# Patient Record
Sex: Female | Born: 1965 | Race: Black or African American | Hispanic: No | Marital: Married | Smoking: Never smoker
Health system: Southern US, Community
[De-identification: ages and names within clinical notes are randomized; demographics above are authoritative.]

## PROBLEM LIST (undated history)

## (undated) DIAGNOSIS — I10 Essential (primary) hypertension: Secondary | ICD-10-CM

## (undated) DIAGNOSIS — T7840XA Allergy, unspecified, initial encounter: Secondary | ICD-10-CM

## (undated) DIAGNOSIS — D219 Benign neoplasm of connective and other soft tissue, unspecified: Secondary | ICD-10-CM

## (undated) DIAGNOSIS — N946 Dysmenorrhea, unspecified: Secondary | ICD-10-CM

## (undated) HISTORY — DX: Dysmenorrhea, unspecified: N94.6

## (undated) HISTORY — DX: Allergy, unspecified, initial encounter: T78.40XA

## (undated) HISTORY — PX: MYOMECTOMY: SHX85

## (undated) HISTORY — DX: Benign neoplasm of connective and other soft tissue, unspecified: D21.9

## (undated) HISTORY — PX: LASER ABLATION OF THE CERVIX: SHX1949

---

## 2008-04-05 ENCOUNTER — Other Ambulatory Visit: Admission: RE | Admit: 2008-04-05 | Discharge: 2008-04-05 | Payer: Self-pay | Admitting: Obstetrics & Gynecology

## 2009-10-09 ENCOUNTER — Emergency Department (HOSPITAL_COMMUNITY): Admission: EM | Admit: 2009-10-09 | Discharge: 2009-10-09 | Payer: Self-pay | Admitting: Emergency Medicine

## 2009-10-10 ENCOUNTER — Observation Stay (HOSPITAL_COMMUNITY): Admission: EM | Admit: 2009-10-10 | Discharge: 2009-10-10 | Payer: Self-pay | Admitting: Family Medicine

## 2009-10-10 ENCOUNTER — Ambulatory Visit: Payer: Self-pay | Admitting: Family Medicine

## 2009-10-16 ENCOUNTER — Ambulatory Visit (HOSPITAL_COMMUNITY): Admission: RE | Admit: 2009-10-16 | Discharge: 2009-10-16 | Payer: Self-pay | Admitting: Family Medicine

## 2011-02-19 LAB — COMPREHENSIVE METABOLIC PANEL
ALT: 11 U/L (ref 0–35)
BUN: 14 mg/dL (ref 6–23)
CO2: 24 mEq/L (ref 19–32)
Calcium: 9 mg/dL (ref 8.4–10.5)
Calcium: 9.7 mg/dL (ref 8.4–10.5)
Creatinine, Ser: 0.8 mg/dL (ref 0.4–1.2)
GFR calc non Af Amer: >60 mL/min (ref >60)
Glucose, Bld: 104 mg/dL — ABNORMAL HIGH (ref 70–99)
Glucose, Bld: 109 mg/dL — ABNORMAL HIGH (ref 70–99)
Sodium: 137 mEq/L (ref 135–145)
Total Protein: 6.6 g/dL (ref 6.0–8.3)

## 2011-02-19 LAB — CBC
Hemoglobin: 11.9 g/dL — ABNORMAL LOW (ref 12.0–15.0)
Hemoglobin: 12.7 g/dL (ref 12.0–15.0)
MCHC: 33.8 g/dL (ref 30.0–36.0)
MCHC: 34.4 g/dL (ref 30.0–36.0)
MCV: 86.5 fL (ref 78.0–100.0)
RBC: 4.34 MIL/uL (ref 3.87–5.11)
RDW: 13.1 % (ref 11.5–15.5)
RDW: 13.3 % (ref 11.5–15.5)

## 2011-02-19 LAB — PROTIME-INR
INR: 1 (ref 0.00–1.49)
Prothrombin Time: 13.1 seconds (ref 11.6–15.2)

## 2011-02-19 LAB — LIPID PANEL
LDL Cholesterol: 89 mg/dL (ref 0–99)
Total CHOL/HDL Ratio: 3.5 RATIO
VLDL: 14 mg/dL (ref 0–40)

## 2011-02-19 LAB — CARDIAC PANEL(CRET KIN+CKTOT+MB+TROPI)
Relative Index: INVALID (ref 0.0–2.5)
Total CK: 76 U/L (ref 7–177)
Total CK: 86 U/L (ref 7–177)
Troponin I: 0.01 ng/mL (ref 0.00–0.06)

## 2011-02-19 LAB — POCT CARDIAC MARKERS: Myoglobin, poc: 75.4 ng/mL (ref 12–200)

## 2011-02-19 LAB — DIFFERENTIAL
Lymphocytes Relative: 29 % (ref 12–46)
Lymphs Abs: 2.3 10*3/uL (ref 0.7–4.0)
Neutro Abs: 5.2 10*3/uL (ref 1.7–7.7)
Neutrophils Relative %: 66 % (ref 43–77)

## 2011-02-19 LAB — H. PYLORI ANTIBODY, IGG: H Pylori IgG: <0.4 {ISR}

## 2011-02-19 LAB — APTT: aPTT: 32 seconds (ref 24–37)

## 2011-02-19 LAB — TSH: TSH: 3.218 u[IU]/mL (ref 0.350–4.500)

## 2011-04-17 ENCOUNTER — Other Ambulatory Visit: Payer: Self-pay | Admitting: Family Medicine

## 2011-04-17 DIAGNOSIS — R102 Pelvic and perineal pain: Secondary | ICD-10-CM

## 2011-04-25 ENCOUNTER — Other Ambulatory Visit: Payer: Self-pay

## 2011-11-05 ENCOUNTER — Ambulatory Visit (INDEPENDENT_AMBULATORY_CARE_PROVIDER_SITE_OTHER): Payer: BC Managed Care – PPO

## 2011-11-05 DIAGNOSIS — I10 Essential (primary) hypertension: Secondary | ICD-10-CM

## 2012-01-04 ENCOUNTER — Encounter (HOSPITAL_COMMUNITY): Payer: Self-pay | Admitting: Emergency Medicine

## 2012-01-04 ENCOUNTER — Emergency Department (HOSPITAL_COMMUNITY)
Admission: EM | Admit: 2012-01-04 | Discharge: 2012-01-05 | Disposition: A | Discharge status: Home / Self Care | Payer: BC Managed Care – PPO | Attending: Emergency Medicine | Admitting: Emergency Medicine

## 2012-01-04 DIAGNOSIS — R109 Unspecified abdominal pain: Secondary | ICD-10-CM | POA: Insufficient documentation

## 2012-01-04 DIAGNOSIS — Z79899 Other long term (current) drug therapy: Secondary | ICD-10-CM | POA: Insufficient documentation

## 2012-01-04 DIAGNOSIS — R002 Palpitations: Secondary | ICD-10-CM | POA: Insufficient documentation

## 2012-01-04 DIAGNOSIS — R6883 Chills (without fever): Secondary | ICD-10-CM | POA: Insufficient documentation

## 2012-01-04 DIAGNOSIS — R000 Tachycardia, unspecified: Secondary | ICD-10-CM | POA: Insufficient documentation

## 2012-01-04 DIAGNOSIS — R21 Rash and other nonspecific skin eruption: Secondary | ICD-10-CM | POA: Insufficient documentation

## 2012-01-04 DIAGNOSIS — R1310 Dysphagia, unspecified: Secondary | ICD-10-CM | POA: Insufficient documentation

## 2012-01-04 DIAGNOSIS — R42 Dizziness and giddiness: Secondary | ICD-10-CM | POA: Insufficient documentation

## 2012-01-04 DIAGNOSIS — T781XXA Other adverse food reactions, not elsewhere classified, initial encounter: Secondary | ICD-10-CM | POA: Insufficient documentation

## 2012-01-04 DIAGNOSIS — R498 Other voice and resonance disorders: Secondary | ICD-10-CM | POA: Insufficient documentation

## 2012-01-04 DIAGNOSIS — R110 Nausea: Secondary | ICD-10-CM | POA: Insufficient documentation

## 2012-01-04 DIAGNOSIS — T7840XA Allergy, unspecified, initial encounter: Secondary | ICD-10-CM

## 2012-01-04 DIAGNOSIS — R0602 Shortness of breath: Secondary | ICD-10-CM | POA: Insufficient documentation

## 2012-01-04 HISTORY — DX: Essential (primary) hypertension: I10

## 2012-01-04 MED ORDER — METHYLPREDNISOLONE SODIUM SUCC 125 MG IJ SOLR
125.0000 mg | Freq: Once | INTRAMUSCULAR | Status: AC
  Administered 2012-01-04: 125 mg via INTRAVENOUS
  Filled 2012-01-04: qty 2

## 2012-01-04 MED ORDER — PREDNISONE 10 MG PO TABS: 20.0000 mg | ORAL_TABLET | Freq: Every day | ORAL | Status: DC

## 2012-01-04 MED ORDER — FAMOTIDINE 20 MG PO TABS: 20.0000 mg | ORAL_TABLET | Freq: Two times a day (BID) | ORAL | Status: DC

## 2012-01-04 MED ORDER — FAMOTIDINE IN NACL 20-0.9 MG/50ML-% IV SOLN
20.0000 mg | Freq: Once | INTRAVENOUS | Status: AC
  Administered 2012-01-04: 20 mg via INTRAVENOUS
  Filled 2012-01-04: qty 50

## 2012-01-04 NOTE — ED Provider Notes (Signed)
History     CSN: [id number]  Arrival date & time 01/04/12  2116   First MD Initiated Contact with Patient 01/04/12 2145      Chief Complaint  Patient presents with  . Allergic Reaction  . Angioedema    (Consider location/radiation/quality/duration/timing/severity/associated sxs/prior treatment) HPI Comments: Approximately 30 minutes after eating crab legs.  Patient noticed a feeling of lightheadedness thinning of her tong diffuse erythema and pruritus.   She states that she has had a previous mild allergic reaction to crab legs, but dismissed it as she "loves" crab legs has had no other reaction to seafood, but does react similarly to chicken.  Has never had to come to the hospital for allergic reaction.  Usually this is treated at home with by mouth Benadryl  Patient is a 46 y.o. female presenting with allergic reaction. The history is provided by the patient.  Allergic Reaction The primary symptoms are  shortness of breath, abdominal pain, nausea, dizziness, palpitations, rash, angioedema and urticaria. The primary symptoms do not include cough, vomiting or diarrhea. The current episode started less than 1 hour ago.  The shortness of breath began today. The shortness of breath developed suddenly.  The abdominal pain began today. The abdominal pain is generalized. The severity of the abdominal pain is 2/10.  She describes the dizziness as lightheadedness. The dizziness began today. The dizziness has been resolved since its onset. Dizziness also occurs with nausea. Dizziness does not occur with vomiting or weakness.   The palpitations also occurred with dizziness and shortness of breath.   The rash began today.  The angioedema began less than 1 hour ago. The angioedema has been resolved since its onset. It is located on the tongue. The angioedema is associated with shortness of breath.   The urticaria began less than 1 hour ago. The urticaria has been rapidly worsening since its onset.    The onset of the reaction was associated with eating.    Past Medical History  Diagnosis Date  . Hypertension     No past surgical history on file.  No family history on file.  History  Substance Use Topics  . Smoking status: Never Smoker   . Smokeless tobacco: Not on file  . Alcohol Use: Yes    OB History    Grav Para Term Preterm Abortions TAB SAB Ect Mult Living                  Review of Systems  Constitutional: Positive for chills. Negative for activity change.  HENT: Positive for trouble swallowing and voice change.   Eyes: Negative for visual disturbance.  Respiratory: Positive for shortness of breath. Negative for cough and choking.   Cardiovascular: Positive for palpitations.  Gastrointestinal: Positive for nausea and abdominal pain. Negative for vomiting and diarrhea.  Skin: Positive for color change and rash.  Neurological: Positive for dizziness. Negative for weakness and numbness.    Allergies  Review of patient's allergies indicates no known allergies.  Home Medications   Current Outpatient Rx  Name Route Sig Dispense Refill  . LISINOPRIL-HYDROCHLOROTHIAZIDE 10-12.5 MG PO TABS Oral Take 1 tablet by mouth daily.    Marland Kitchen FAMOTIDINE 20 MG PO TABS Oral Take 1 tablet (20 mg total) by mouth 2 (two) times daily. 14 tablet 0  . PREDNISONE 10 MG PO TABS Oral Take 2 tablets (20 mg total) by mouth daily. 15 tablet 0    BP 157/78  Pulse 118  Temp(Src) 98.5 F (36.9 C) (  Oral)  Resp 20  Ht 5\' 1"  (1.549 m)  Wt 178 lb 6.4 oz (80.922 kg)  BMI 33.71 kg/m2  SpO2 98%  LMP 12/14/2011  Physical Exam  Constitutional: She is oriented to person, place, and time. She appears well-developed and well-nourished. No distress.  HENT:  Head: Normocephalic. No trismus in the jaw.  Nose: Nose normal.  Mouth/Throat: No uvula swelling. No posterior oropharyngeal edema.  Eyes: Pupils are equal, round, and reactive to light.  Neck: No tracheal deviation present.   Cardiovascular: Regular rhythm.  Tachycardia present.   Pulmonary/Chest: Breath sounds normal. No respiratory distress. She has no wheezes.  Abdominal: Soft. She exhibits no distension. There is no tenderness.  Musculoskeletal: She exhibits no edema and no tenderness.  Neurological: She is alert and oriented to person, place, and time.  Skin: Skin is warm. Rash noted. She is not diaphoretic. There is erythema.    ED Course  Procedures (including critical care time)  Labs Reviewed - No data to display No results found.   1. Allergic reaction     1150 patient reassessed.  Tongue feels back to normal.  No visible swelling, palms and soles are back to normal color.  No more ureter.  Not tachycardic.  Breath sounds clear  MDM  Acute allergic reaction        Arman Filter, NP 01/05/12 0002  Arman Filter, NP 01/05/12 0002

## 2012-01-04 NOTE — ED Notes (Signed)
Pt had crab legs earlier around 6:30 pm and is having an allergic reaction.  Pt's face is moderately swollen and is feeling itchy all over.  Not having any trouble breathing or feeling like her throat is affected.

## 2012-01-04 NOTE — ED Notes (Signed)
Pt states @ 1 hour after eating crab legs began experiencing itching all over, swelling noted to face, hands, hives noted to chest. Husband states pt did take 2 Benadryl earlier after he noticed speech change. Patient states she did have minor reaction the last time she had crab legs, did subside with Benadryl. Speech clear at this time. Husband at bedside.

## 2012-01-05 ENCOUNTER — Encounter (HOSPITAL_COMMUNITY): Payer: Self-pay | Admitting: Family Medicine

## 2012-01-05 MED ORDER — EPINEPHRINE 0.3 MG/0.3ML IJ DEVI: 0.3000 mg | INTRAMUSCULAR | Status: DC | PRN

## 2012-01-05 MED ORDER — EPINEPHRINE 0.3 MG/0.3ML IJ DEVI: 0.3000 mg | INTRAMUSCULAR | Status: AC | PRN

## 2012-01-05 NOTE — ED Provider Notes (Signed)
Medical screening examination/treatment/procedure(s) were performed by non-physician practitioner and as supervising physician I was immediately available for consultation/collaboration.  Nicholes Stairs, MD 01/05/12 [id number]

## 2012-01-05 NOTE — Discharge Instructions (Signed)
Always carry her EpiPen with you. I have written for 2 keep one in your  pocketbook  and one in your car or home wherever you spend the most time.

## 2012-02-29 ENCOUNTER — Ambulatory Visit (INDEPENDENT_AMBULATORY_CARE_PROVIDER_SITE_OTHER): Payer: BC Managed Care – PPO | Admitting: Emergency Medicine

## 2012-02-29 VITALS — BP 144/86 | HR 91 | Temp 98.3°F | Resp 20 | Ht 60.5 in | Wt 179.0 lb

## 2012-02-29 DIAGNOSIS — H1045 Other chronic allergic conjunctivitis: Secondary | ICD-10-CM

## 2012-02-29 DIAGNOSIS — J029 Acute pharyngitis, unspecified: Secondary | ICD-10-CM

## 2012-02-29 DIAGNOSIS — J309 Allergic rhinitis, unspecified: Secondary | ICD-10-CM

## 2012-02-29 DIAGNOSIS — H1010 Acute atopic conjunctivitis, unspecified eye: Secondary | ICD-10-CM

## 2012-02-29 MED ORDER — PREDNISONE 10 MG PO TABS: ORAL_TABLET | ORAL | Status: DC

## 2012-02-29 MED ORDER — OLOPATADINE HCL 0.1 % OP SOLN: 1.0000 [drp] | Freq: Two times a day (BID) | OPHTHALMIC | Status: DC

## 2012-02-29 NOTE — Progress Notes (Signed)
  Subjective:    Patient ID: Mckenzie Hill, female    DOB: 04-Oct-1966, 46 y.o.   MRN: [id number]  HPI enters with chief complaints of worsening allergies. She has had irritation and redness of both eyes. She recently had a bad reaction to shellfish. She had to be treated at the emergency room. She's had a dry nonproductive cough. She denies any wheezing.    Review of Systems she also said that sore throat associated with these symptoms.     Objective:   Physical Exam  Constitutional: She appears well-developed and well-nourished.  HENT:  Right Ear: External ear normal.  Left Ear: External ear normal.       The throat is slightly red but there is no exudate.  Eyes: Pupils are equal, round, and reactive to light.       There is mild injection of the conjunctiva of both eyes.  Cardiovascular: Normal rate and regular rhythm.   Pulmonary/Chest: Breath sounds normal. No respiratory distress. She has no wheezes. She has no rales. She exhibits no tenderness.          Assessment & Plan:   Patient having a flare of her allergies. We'll do a strep test just to be sure there is no infection and treated as an allergic flare

## 2012-02-29 NOTE — Patient Instructions (Signed)
Allergic Rhinitis  Allergic rhinitis is when the mucous membranes in the nose respond to allergens. Allergens are particles in the air that cause your body to have an allergic reaction. This causes you to release allergic antibodies. Through a chain of events, these eventually cause you to release histamine into the blood stream (hence the use of antihistamines). Although meant to be protective to the body, it is this release that causes your discomfort, such as frequent sneezing, congestion and an itchy runny nose.    CAUSES    The pollen allergens may come from grasses, trees, and weeds. This is seasonal allergic rhinitis, or "hay fever." Other allergens cause year-round allergic rhinitis (perennial allergic rhinitis) such as house dust mite allergen, pet dander and mold spores.    SYMPTOMS     Nasal stuffiness (congestion).   Runny, itchy nose with sneezing and tearing of the eyes.   There is often an itching of the mouth, eyes and ears.  It cannot be cured, but it can be controlled with medications.  DIAGNOSIS    If you are unable to determine the offending allergen, skin or blood testing may find it.  TREATMENT     Avoid the allergen.   Medications and allergy shots (immunotherapy) can help.   Hay fever may often be treated with antihistamines in pill or nasal spray forms. Antihistamines block the effects of histamine. There are over-the-counter medicines that may help with nasal congestion and swelling around the eyes. Check with your caregiver before taking or giving this medicine.  If the treatment above does not work, there are many new medications your caregiver can prescribe. Stronger medications may be used if initial measures are ineffective. Desensitizing injections can be used if medications and avoidance fails. Desensitization is when a patient is given ongoing shots until the body becomes less sensitive to the allergen. Make sure you follow up with your caregiver if problems continue.  SEEK  MEDICAL CARE IF:     You develop fever (more than 100.5 F (38.1 C).   You develop a cough that does not stop easily (persistent).   You have shortness of breath.   You start wheezing.   Symptoms interfere with normal daily activities.  Document Released: 07/29/2001 Document Revised: 10/23/2011 Document Reviewed: 02/07/2009  [employer] Patient Information 2012 [employer].

## 2012-04-08 ENCOUNTER — Ambulatory Visit (INDEPENDENT_AMBULATORY_CARE_PROVIDER_SITE_OTHER): Payer: BC Managed Care – PPO | Admitting: Physician Assistant

## 2012-04-08 VITALS — BP 118/74 | HR 103 | Temp 98.7°F | Resp 16 | Ht 60.25 in | Wt 172.8 lb

## 2012-04-08 DIAGNOSIS — J309 Allergic rhinitis, unspecified: Secondary | ICD-10-CM

## 2012-04-08 MED ORDER — FLUTICASONE PROPIONATE 50 MCG/ACT NA SUSP: 2.0000 | Freq: Every day | NASAL | Status: DC

## 2012-04-08 MED ORDER — PREDNISONE 20 MG PO TABS: ORAL_TABLET | ORAL | Status: AC

## 2012-04-08 NOTE — Progress Notes (Signed)
Patient ID: Sabiha Sura MRN: [id number], DOB: 1966/08/31, 46 y.o. Date of Encounter: 04/08/2012, 2:12 PM  Primary Physician: Tally Due, MD, MD  Chief Complaint: Allergic rhinitis  HPI: 46 y.o. year old female with history below presents with flare up of allergic rhinitis. Seen the previous month for similar symptoms, although this episode is not a severe. She was doing well until several day ago. She recently stopped taking her Claritin which caused a flare up of her allergies. When she was taking her Claritin she was asymptomatic and doing well. She now complains of a 3-4 day history of congestion, rhinorrhea, sneezing, and cough. Afebrile throughout. Cough is nonproductive and secondary to post nasal drip. She requests to refill her Prednisone as this helped her considerably the last time.    Past Medical History  Diagnosis Date  . Hypertension      Home Meds: Prior to Admission medications   Medication Sig Start Date End Date Taking? Authorizing Provider  aspirin-acetaminophen-caffeine (EXCEDRIN MIGRAINE) [phone] MG per tablet Take 1 tablet by mouth every 6 (six) hours as needed.   Yes Historical Provider, MD  EPINEPHrine (EPIPEN) 0.3 mg/0.3 mL DEVI Inject 0.3 mLs (0.3 mg total) into the muscle as needed. 01/05/12  No Arman Filter, NP  lisinopril-hydrochlorothiazide (PRINZIDE,ZESTORETIC) 10-12.5 MG per tablet Take 1 tablet by mouth daily.   Yes Historical Provider, MD  loratadine-pseudoephedrine (CLARITIN-D 12-HOUR) 5-120 MG per tablet Take 1 tablet by mouth 2 (two) times daily.   Yes Historical Provider, MD  famotidine (PEPCID) 20 MG tablet Take 1 tablet (20 mg total) by mouth 2 (two) times daily. 01/04/12 01/03/13  Arman Filter, NP         GuaiFENesin (MUCINEX PO) Take 1 capsule by mouth as needed.   No Historical Provider, MD  guaifenesin (ROBITUSSIN) 100 MG/5ML syrup Take 200 mg by mouth 3 (three) times daily as needed.   No Historical Provider, MD  olopatadine (PATANOL)  0.1 % ophthalmic solution Place 1 drop into both eyes 2 (two) times daily. 02/29/12 02/28/13 No Collene Gobble, MD           Allergies:  Allergies  Allergen Reactions  . Shellfish Allergy Anaphylaxis, Hives and Itching    History   Social History  . Marital Status: Married    Spouse Name: N/A    Number of Children: N/A  . Years of Education: N/A   Occupational History  . Not on file.   Social History Main Topics  . Smoking status: Never Smoker   . Smokeless tobacco: Not on file  . Alcohol Use: Yes  . Drug Use: No  . Sexually Active: Not on file   Other Topics Concern  . Not on file   Social History Narrative  . No narrative on file     Review of Systems: Constitutional: negative for chills, fever, night sweats, weight changes, or fatigue  HEENT: negative for vision changes, hearing loss, ST, epistaxis, or sinus pressure Cardiovascular: negative for chest pain or palpitations Respiratory: negative for hemoptysis, wheezing, shortness of breath, or cough Abdominal: negative for abdominal pain, nausea, vomiting, diarrhea, or constipation Dermatological: negative for rash Neurologic: negative for headache, dizziness, or syncope All other systems reviewed and are otherwise negative with the exception to those above and in the HPI.   Physical Exam: Blood pressure 118/74, pulse 103, temperature 98.7 F (37.1 C), temperature source Oral, resp. rate 16, height 5' 0.25" (1.53 m), weight 172 lb 12.8 oz (78.382 kg), last  menstrual period 03/16/2012, SpO2 99.00%., Body mass index is 33.47 kg/(m^2). General: Well developed, well nourished, in no acute distress. Head: Normocephalic, atraumatic, eyes without discharge, sclera non-icteric, nares are pale and boggy. Bilateral auditory canals clear, TM's are without perforation, pearly grey and translucent with reflective cone of light bilaterally. Oral cavity moist, posterior pharynx without exudate, erythema, peritonsillar abscess, or  post nasal drip.  Neck: Supple. No thyromegaly. Full ROM. No lymphadenopathy. Lungs: Clear bilaterally to auscultation without wheezes, rales, or rhonchi. Breathing is unlabored. Heart: RRR with S1 S2. No murmurs, rubs, or gallops appreciated. Msk:  Strength and tone normal for age. Extremities/Skin: Warm and dry. No clubbing or cyanosis. No edema. No rashes or suspicious lesions. Neuro: Alert and oriented X 3. Moves all extremities spontaneously. Gait is normal. CNII-XII grossly in tact. Psych:  Responds to questions appropriately with a normal affect.     ASSESSMENT AND PLAN:  46 y.o. year old female with allergic rhinitis. -Prednisone 20 mg #12 3x2, 2x2, 1x2 no RF -Flonase 2 sprays each nare daily #1 RF 6 -Continue Claritin -RTC precautions   Signed, Eula Listen, PA-C 04/08/2012 2:12 PM

## 2012-05-17 DIAGNOSIS — Z0271 Encounter for disability determination: Secondary | ICD-10-CM

## 2012-06-29 ENCOUNTER — Ambulatory Visit (INDEPENDENT_AMBULATORY_CARE_PROVIDER_SITE_OTHER): Payer: BC Managed Care – PPO | Admitting: Physician Assistant

## 2012-06-29 VITALS — BP 116/86 | HR 83 | Temp 98.5°F | Resp 16 | Ht 61.0 in | Wt 181.0 lb

## 2012-06-29 DIAGNOSIS — R102 Pelvic and perineal pain: Secondary | ICD-10-CM

## 2012-06-29 DIAGNOSIS — R21 Rash and other nonspecific skin eruption: Secondary | ICD-10-CM

## 2012-06-29 DIAGNOSIS — R1032 Left lower quadrant pain: Secondary | ICD-10-CM

## 2012-06-29 DIAGNOSIS — N949 Unspecified condition associated with female genital organs and menstrual cycle: Secondary | ICD-10-CM

## 2012-06-30 NOTE — Progress Notes (Signed)
Subjective:    Patient ID: Mckenzie Hill, female    DOB: 06-22-1966, 46 y.o.   MRN: [id number]  HPI Pt presents to clinic for multiple concerns - 1- she has noticed that she has started to develop a rash around mouth and neck after eating most fruits and chicken - the rash is red and itchy - this has started to occur over the last several months - she is wondering if she is having an allergy the these foods - she has no SOB or wheezing and the rash has not gotten worse. 2- she has intermittent pressure in chest where she feels she has to burp and let the pressure out - rare occurrence  3- several month h/o LLQ/pelvic pain - intermittent - seems to be several days in a row and then gone for several weeks or even a month and then returns - thinks it is worse around her menses.  Does have constipation which she takes fiber for at times and when she does it improves - the pain is not associated with her constipation.  She does not know if it is related to her chest pressure feeling above.  She has not changed her diet recently.  She is lactose intolerant.  She is having normal menses but does think she has a fibroid.  Never been told she has ovarian cysts.  She thinks this might be gas because the pain can be severe and then it abruptly goes away but does not feel gasy.  She is having no urinary symptoms, no new sexual contacts or concerns about STDs.   Review of Systems  Gastrointestinal: Positive for abdominal pain (LLQ/pelvic pain) and constipation (when she does not take her fiber). Negative for nausea, vomiting, abdominal distention, anal bleeding and rectal pain.  Genitourinary: Negative.        Objective:   Physical Exam  Vitals reviewed. Constitutional: She is oriented to person, place, and time. She appears well-developed and well-nourished.  HENT:  Head: Normocephalic and atraumatic.  Right Ear: External ear normal.  Left Ear: External ear normal.  Cardiovascular: Normal rate, regular  rhythm and normal heart sounds.  Exam reveals no gallop and no friction rub.   No murmur heard. Pulmonary/Chest: Effort normal and breath sounds normal.  Abdominal: Soft. Bowel sounds are normal. She exhibits no mass. There is tenderness (LLQ) in the left lower quadrant. There is no rebound and no guarding.    Genitourinary: Vagina normal and uterus normal. Pelvic exam was performed with patient supine. There is no rash, tenderness, lesion or injury on the right labia. There is no rash, lesion or injury on the left labia. Cervix exhibits no motion tenderness, no discharge and no friability. Right adnexum displays no mass, no tenderness and no fullness. Left adnexum displays no mass, no tenderness and no fullness. No vaginal discharge found.  Neurological: She is alert and oriented to person, place, and time.  Skin: Skin is warm and dry.  Psychiatric: She has a normal mood and affect. Her behavior is normal. Judgment and thought content normal.          Assessment & Plan:   1. LLQ abdominal pain  H. pylori antibody, IgG, US Transvaginal Non-OB, US Pelvis Complete  2. Rash  Allergen food profile specific IgE  3. Esophagitis, unspecified  H. pylori antibody, IgG  4. Pelvic pain in female  US Transvaginal Non-OB, US Pelvis Complete   1/4- ? Related to either constipation and curve of large intestine in her  LLQ with sludge or gas or ? Possible ovarian cyst due to timing with menses - will check in [place] to also note fibroid and there location because that should also be a possible cause of her pain 2- check IgE to food p- if nothing positive and rash continues consider allergist referral 3- check h pylori - probably not the cause of her abdominal pain but should rule out  For now will do no medications - will wait for labs to return and [place] to be completed.  Pt can use OTC meds for heartburn when she gets her chest pressure sensations.  She can try gas X - I would monitor her food intake in regards  to her rash and keep a record.  Pt understands and agrees with the above plan.  We discussed warning signs or when to RTC.

## 2012-07-01 ENCOUNTER — Ambulatory Visit
Admission: RE | Admit: 2012-07-01 | Discharge: 2012-07-01 | Disposition: A | Discharge status: Home / Self Care | Payer: BC Managed Care – PPO | Source: Ambulatory Visit | Attending: Physician Assistant | Admitting: Physician Assistant

## 2012-07-01 ENCOUNTER — Other Ambulatory Visit: Payer: BC Managed Care – PPO

## 2012-07-01 DIAGNOSIS — R102 Pelvic and perineal pain: Secondary | ICD-10-CM

## 2012-07-01 DIAGNOSIS — R1032 Left lower quadrant pain: Secondary | ICD-10-CM

## 2012-07-02 LAB — ALLERGEN FOOD PROFILE SPECIFIC IGE
Chicken IgE: 0.41 kU/L — ABNORMAL HIGH
Milk IgE: <0.1 kU/L
Orange: <0.1 kU/L
Soybean IgE: <0.1 kU/L
Tomato IgE: <0.1 kU/L
Tuna IgE: <0.1 kU/L

## 2012-07-13 ENCOUNTER — Other Ambulatory Visit: Payer: Self-pay

## 2012-07-13 DIAGNOSIS — N83209 Unspecified ovarian cyst, unspecified side: Secondary | ICD-10-CM

## 2012-07-13 DIAGNOSIS — D219 Benign neoplasm of connective and other soft tissue, unspecified: Secondary | ICD-10-CM

## 2012-07-14 ENCOUNTER — Other Ambulatory Visit: Payer: Self-pay | Admitting: Physician Assistant

## 2012-07-14 NOTE — Telephone Encounter (Signed)
Please pull chart.

## 2012-07-14 NOTE — Telephone Encounter (Signed)
Patient's chart is at the nurses station in the pa pool pile. [facility] [id number]

## 2012-10-26 ENCOUNTER — Other Ambulatory Visit: Payer: Self-pay | Admitting: Physician Assistant

## 2012-11-19 ENCOUNTER — Ambulatory Visit (INDEPENDENT_AMBULATORY_CARE_PROVIDER_SITE_OTHER): Payer: BC Managed Care – PPO | Admitting: Family Medicine

## 2012-11-19 ENCOUNTER — Ambulatory Visit: Payer: BC Managed Care – PPO

## 2012-11-19 VITALS — BP 122/78 | HR 119 | Temp 100.1°F | Resp 16 | Ht 60.5 in | Wt 184.6 lb

## 2012-11-19 DIAGNOSIS — J309 Allergic rhinitis, unspecified: Secondary | ICD-10-CM

## 2012-11-19 DIAGNOSIS — I10 Essential (primary) hypertension: Secondary | ICD-10-CM

## 2012-11-19 DIAGNOSIS — R05 Cough: Secondary | ICD-10-CM

## 2012-11-19 DIAGNOSIS — G43909 Migraine, unspecified, not intractable, without status migrainosus: Secondary | ICD-10-CM

## 2012-11-19 DIAGNOSIS — R509 Fever, unspecified: Secondary | ICD-10-CM

## 2012-11-19 LAB — COMPREHENSIVE METABOLIC PANEL
ALT: 10 U/L (ref 0–35)
BUN: 8 mg/dL (ref 6–23)
CO2: 27 mEq/L (ref 19–32)
Calcium: 9.9 mg/dL (ref 8.4–10.5)
Chloride: 102 mEq/L (ref 96–112)
Creat: 0.75 mg/dL (ref 0.50–1.10)

## 2012-11-19 LAB — POCT CBC
HCT, POC: 43.1 % (ref 37.7–47.9)
Hemoglobin: 13.4 g/dL (ref 12.2–16.2)
MCH, POC: 28 pg (ref 27–31.2)
MCV: 90.1 fL (ref 80–97)
RBC: 4.78 M/uL (ref 4.04–5.48)
WBC: 8.4 10*3/uL (ref 4.6–10.2)

## 2012-11-19 LAB — POCT INFLUENZA A/B: Influenza B, POC: NEGATIVE

## 2012-11-19 LAB — TSH: TSH: 1.257 u[IU]/mL (ref 0.350–4.500)

## 2012-11-19 MED ORDER — LISINOPRIL-HYDROCHLOROTHIAZIDE 10-12.5 MG PO TABS: 1.0000 | ORAL_TABLET | Freq: Every day | ORAL | Status: DC

## 2012-11-19 MED ORDER — PREDNISONE 20 MG PO TABS: ORAL_TABLET | ORAL | Status: DC

## 2012-11-19 NOTE — Progress Notes (Signed)
[address]   [place], [place]  [address]   [phone]  Subjective:    Patient ID: Mckenzie Hill, female    DOB: [birth year], 47 y.o.   MRN: [id number]  HPIThis 47 y.o. female presents for evaluation of cough.  +fever in office; none at home.  No chills/sweats.  +HA with coughing.  +ST; no ear pain; mild pain with swallowing.  No rhinorrhea or nasal congestion.  Lots of coughing; +chest pain; no sputum; uncontrollable cough with post-tussive emesis.  Thinks it is allergies; this has happened before multiple times in past; tried to stop Sudafed; after summer, developed horrible cough.  Prescribed Flonase at last visit to use instead of Sudafed; if stops Sudafed, cough recurs.  Allergy symptoms start with coughing, chest pain, sore throat such as current symptoms.  Stopped Sudafed two weeks ago; using Flonase every day.  No n/v/d.  No abdominal pain.   Started Sudafed and Flonase yesterday.  No myalgias.  No previous CXR.  No history of asthma.  Prednisone always works well when allergy symptoms acutely worsen; would like rx for Prednisone.  2. HTN:  Follow-up; needs refill on medication ;diagnosed three years ago.    BP will improve with weight loss.  Home BP good; 125/75.  No chest pain, palpitations, shortness of breath, leg swelling.  +HA lately.  Losing weight helps improve BP.  No dizziness/light-headedness/paresthesias/focal weakness.  3.  Headaches:   onset in past two months; first headache lasted one week; severe headaches; smells bother her; +nausea; sharp headache; mild photophobia; +phonophobia.  No migraines in past until 09/2012.  OCP for one month due to dysmenorrhea; s/p GYN consult; uterine fibroids; s/p pelvic ultrasound; rx progesterone only; felt nauseated on progesterone.  No increase in stress; sleeping moderately well.  No triggers to food; Excedrin migraine works well.  No diplopia, blurred vision, paresthesias, focal weakness.  Can have nighttime awakening.  Having headaches weekly  almost but not exactly sure; has not kept track of headaches.  No family history of migraines.  LMP 11-04-12.   Review of Systems  Constitutional: Negative for fever, chills, diaphoresis and fatigue.  HENT: Positive for sore throat and voice change. Negative for ear pain, congestion, rhinorrhea, sneezing, trouble swallowing and postnasal drip.   Respiratory: Positive for cough and chest tightness. Negative for shortness of breath, wheezing and stridor.   Cardiovascular: Negative for chest pain, palpitations and leg swelling.  Gastrointestinal: Positive for nausea. Negative for vomiting and diarrhea.  Neurological: Positive for headaches. Negative for dizziness, tremors, syncope, facial asymmetry, speech difficulty, weakness, light-headedness and numbness.  Psychiatric/Behavioral: Negative for sleep disturbance and dysphoric mood. The patient is not nervous/anxious.         Past Medical History  Diagnosis Date  . Hypertension   . Allergy     History reviewed. No pertinent past surgical history.  Prior to Admission medications   Medication Sig Start Date End Date Taking? Authorizing Provider  EPINEPHrine (EPIPEN) 0.3 mg/0.3 mL DEVI Inject 0.3 mLs (0.3 mg total) into the muscle as needed. 01/05/12  Yes Arman Filter, NP  fluticasone (FLONASE) 50 MCG/ACT nasal spray Place 2 sprays into the nose daily. 04/08/12 04/08/13 Yes Ryan M Dunn, PA-C  lisinopril-hydrochlorothiazide (PRINZIDE,ZESTORETIC) 10-12.5 MG per tablet TAKE ONE TABLET BY MOUTH EVERY DAY 10/26/12  Yes Heather M Marte, PA-C  aspirin-acetaminophen-caffeine (EXCEDRIN MIGRAINE) [phone] MG per tablet Take 1 tablet by mouth every 6 (six) hours as needed.    Historical Provider, MD  famotidine (PEPCID)  20 MG tablet Take 1 tablet (20 mg total) by mouth 2 (two) times daily. 01/04/12 01/03/13  Arman Filter, NP  GuaiFENesin (MUCINEX PO) Take 1 capsule by mouth as needed.    Historical Provider, MD  guaifenesin (ROBITUSSIN) 100 MG/5ML syrup  Take 200 mg by mouth 3 (three) times daily as needed.    Historical Provider, MD  loratadine-pseudoephedrine (CLARITIN-D 12-HOUR) 5-120 MG per tablet Take 1 tablet by mouth 2 (two) times daily.    Historical Provider, MD  olopatadine (PATANOL) 0.1 % ophthalmic solution Place 1 drop into both eyes 2 (two) times daily. 02/29/12 02/28/13  Collene Gobble, MD    Allergies  Allergen Reactions  . Shellfish Allergy Anaphylaxis, Hives and Itching    History   Social History  . Marital Status: Married    Spouse Name: N/A    Number of Children: N/A  . Years of Education: N/A   Occupational History  . Not on file.   Social History Main Topics  . Smoking status: Never Smoker   . Smokeless tobacco: Not on file  . Alcohol Use: No  . Drug Use: No  . Sexually Active: Yes    Birth Control/ Protection: None   Other Topics Concern  . Not on file   Social History Narrative  . No narrative on file    Family History  Problem Relation Age of Onset  . Alzheimer's disease Mother   . Hypertension Mother   . Diabetes Father   . Hypertension Father     Objective:   Physical Exam  Nursing note and vitals reviewed. Constitutional: She is oriented to person, place, and time. She appears well-developed and well-nourished. No distress.  HENT:  Head: Normocephalic and atraumatic.  Right Ear: External ear normal.  Left Ear: External ear normal.  Nose: Nose normal.  Mouth/Throat: Oropharynx is clear and moist.  Eyes: Conjunctivae normal and EOM are normal. Pupils are equal, round, and reactive to light.  Neck: Normal range of motion. Neck supple. No thyromegaly present.  Cardiovascular: Normal rate, regular rhythm and intact distal pulses.  Exam reveals no gallop and no friction rub.   No murmur heard. Pulmonary/Chest: Effort normal and breath sounds normal. She has no wheezes. She has no rales.  Lymphadenopathy:    She has no cervical adenopathy.  Neurological: She is alert and oriented to  person, place, and time. No cranial nerve deficit. She exhibits normal muscle tone. Coordination normal.  Skin: She is not diaphoretic.  Psychiatric: She has a normal mood and affect. Her behavior is normal.   Results for orders placed in visit on 11/19/12  POCT INFLUENZA A/B      Component Value Range   Influenza A, POC Negative     Influenza B, POC Negative    POCT CBC      Component Value Range   WBC 8.4  4.6 - 10.2 K/uL   Lymph, poc 0.8  0.6 - 3.4   POC LYMPH PERCENT 10.0  10 - 50 %L   MID (cbc) 0.3  0 - 0.9   POC MID % 3.9  0 - 12 %M   POC Granulocyte 7.2 (*) 2 - 6.9   Granulocyte percent 86.1 (*) 37 - 80 %G   RBC 4.78  4.04 - 5.48 M/uL   Hemoglobin 13.4  12.2 - 16.2 g/dL   HCT, POC 16.1  09.6 - 47.9 %   MCV 90.1  80 - 97 fL   MCH, POC 28.0  27 - 31.2 pg   MCHC 31.1 (*) 31.8 - 35.4 g/dL   RDW, POC 96.0     Platelet Count, POC 369  142 - 424 K/uL   MPV 7.4  0 - 99.8 fL  POCT GLYCOSYLATED HEMOGLOBIN (HGB A1C)      Component Value Range   Hemoglobin A1C 5.2      [facility] reading (PRIMARY) by  Dr. Katrinka Blazing. CXR:  NAD.     Assessment & Plan:   1. Cough  POCT Influenza A/B, DG Chest 2 View  2. Fever  POCT Influenza A/B, DG Chest 2 View  3. Essential hypertension, benign  POCT CBC, Comprehensive metabolic panel, TSH, POCT glycosylated hemoglobin (Hb A1C)  4. Allergic rhinitis, cause unspecified    5. Migraine headache      1. Cough:  Recurrent issue; CXR negative; usually associated with allergic rhinitis worsening.  Treat with Prednisone per patient request.  Consider addition of Singulair to current regimen if allergic rhinitis symptoms persist.  Also consider spirometry if cough persists; may have allergic induced asthma. 2.  Fever:  New.  Low grade.  Associated with cough, scratchy throat yet patient feels current symptoms due to allergic rhinitis. Flu swab negative; WBC count normal.  To call if fever persists or develops further symptoms. 3.  HTN:  Controlled; refill  provided; obtain labs; recommend weight loss and exercise.  Follow-up in six months. 4.  Migraine HA:  New onset.  Normal neuro exam.  Consistent with migraine (phonophophia, nausea, severe HA, sensitivity to smells).  Very reluctant to start prophylactic medication; desires to keep diary of headaches and keep food diary.  Encourage to return if having weekly headaches.  Continue Excedrin Migraine PRN at this time.  Discussed potential triggers to migraines including stress, insomnia, caffeine, foods.    Meds ordered this encounter  Medications  . lisinopril-hydrochlorothiazide (PRINZIDE,ZESTORETIC) 10-12.5 MG per tablet    Sig: Take 1 tablet by mouth daily.    Dispense:  30 tablet    Refill:  5  . predniSONE (DELTASONE) 20 MG tablet    Sig: Three tablets daily x 1 day, then two tablets daily x 5 days, then one tablet daily x 5 days    Dispense:  18 tablet    Refill:  0

## 2012-11-19 NOTE — Patient Instructions (Addendum)
1. Cough  POCT Influenza A/B, DG Chest 2 View  2. Fever  POCT Influenza A/B, DG Chest 2 View  3. Essential hypertension, benign  POCT CBC, Comprehensive metabolic panel, TSH, POCT glycosylated hemoglobin (Hb A1C)  4. Allergic rhinitis, cause unspecified    5. Migraine headache        1.  KEEP DIARY OF HEADACHES OVER NEXT 1-3 MONTHS.  RETURN IF SUFFERING WITH WEEKLY HEADACHES TO DISCUSS FURTHER TREATMENT OPTIONS. 2.  IF ALLERGY SYMPTOMS PERSIST, CONSIDER ADDING ANOTHER MEDICATION.  ALSO CONSIDER UNDERGOING ALLERGY TESTING IF NOT PREVIOUSLY PRESCRIBED. 3.  CALL IF DEVELOPS FEVER OVER NEXT THREE DAYS.

## 2013-02-06 ENCOUNTER — Ambulatory Visit: Payer: BC Managed Care – PPO | Admitting: Family Medicine

## 2013-02-06 VITALS — BP 118/80 | HR 91 | Temp 98.6°F | Resp 16 | Ht 60.5 in | Wt 179.0 lb

## 2013-02-06 DIAGNOSIS — T781XXA Other adverse food reactions, not elsewhere classified, initial encounter: Secondary | ICD-10-CM

## 2013-02-06 DIAGNOSIS — J309 Allergic rhinitis, unspecified: Secondary | ICD-10-CM

## 2013-02-06 MED ORDER — FLUTICASONE PROPIONATE 50 MCG/ACT NA SUSP: 2.0000 | Freq: Every day | NASAL | Status: DC

## 2013-02-06 MED ORDER — PREDNISONE 10 MG PO TABS: ORAL_TABLET | ORAL | Status: DC

## 2013-02-06 NOTE — Progress Notes (Signed)
[facility]:  Office Visit  Chief Complaint:  Chief Complaint  Patient presents with  . allergies    x Wednesday    HPI: Mckenzie Hill is a 47 y.o. female who complains of  2 day history of allergy sxs. She gets allergy symptoms regular. Takes Flonase and also antihistamine regular without relief. She has dry cough. Both her and her husband were dx with allergies last year, they do not have pets, they change their filters q 3 months, she has food allergies to [place] nd shellfish, they live in a carpeted house. Prednisone seems to works for her very well. However she is concerned she may have other food allergies. No fevers, chills, SOB.   Past Medical History  Diagnosis Date  . Hypertension   . Allergy   . Fibroids   . Dysmenorrhea    Past Surgical History  Procedure Laterality Date  . Laser ablation of the cervix    . Myomectomy     History   Social History  . Marital Status: Married    Spouse Name: N/A    Number of Children: N/A  . Years of Education: N/A   Social History Main Topics  . Smoking status: Never Smoker   . Smokeless tobacco: None  . Alcohol Use: No  . Drug Use: No  . Sexually Active: Yes    Birth Control/ Protection: None   Other Topics Concern  . None   Social History Narrative  . None   Family History  Problem Relation Age of Onset  . Alzheimer's disease Mother   . Hypertension Mother   . Diabetes Father   . Hypertension Father    Allergies  Allergen Reactions  . Shellfish Allergy Anaphylaxis, Hives and Itching   Prior to Admission medications   Medication Sig Start Date End Date Taking? Authorizing Provider  aspirin-acetaminophen-caffeine (EXCEDRIN MIGRAINE) [phone] MG per tablet Take 1 tablet by mouth every 6 (six) hours as needed.   Yes Historical Provider, MD  EPINEPHrine (EPIPEN) 0.3 mg/0.3 mL DEVI Inject 0.3 mLs (0.3 mg total) into the muscle as needed. 01/05/12  Yes Arman Filter, NP  fluticasone (FLONASE) 50  MCG/ACT nasal spray Place 2 sprays into the nose daily. 04/08/12 04/08/13 Yes Ryan M Dunn, PA-C  lisinopril-hydrochlorothiazide (PRINZIDE,ZESTORETIC) 10-12.5 MG per tablet Take 1 tablet by mouth daily. 11/19/12  Yes Ethelda Chick, MD  predniSONE (DELTASONE) 20 MG tablet Three tablets daily x 1 day, then two tablets daily x 5 days, then one tablet daily x 5 days 11/19/12   Ethelda Chick, MD     ROS: The patient denies fevers, chills, night sweats, unintentional weight loss, chest pain, palpitations, wheezing, dyspnea on exertion, nausea, vomiting, abdominal pain, dysuria, hematuria, melena, numbness, weakness, or tingling.   All other systems have been reviewed and were otherwise negative with the exception of those mentioned in the HPI and as above.    PHYSICAL EXAM: Filed Vitals:   02/06/13 0846  BP: 118/80  Pulse: 91  Temp: 98.6 F (37 C)  Resp: 16   Filed Vitals:   02/06/13 0846  Height: 5' 0.5" (1.537 m)  Weight: 179 lb (81.194 kg)   Body mass index is 34.37 kg/(m^2).  General: Alert, no acute distress HEENT:  Normocephalic, atraumatic, oropharynx patent. + boggy bares, no sinus tenderness currently, TM nl  Cardiovascular:  Regular rate and rhythm, no rubs murmurs or gallops.  No Carotid bruits, radial pulse intact. No pedal edema.  Respiratory:  Clear to auscultation bilaterally.  No wheezes, rales, or rhonchi.  No cyanosis, no use of accessory musculature GI: No organomegaly, abdomen is soft and non-tender, positive bowel sounds.  No masses. Skin: No rashes. Neurologic: Facial musculature symmetric. Psychiatric: Patient is appropriate throughout our interaction. Lymphatic: No cervical lymphadenopathy Musculoskeletal: Gait intact.   LABS: Results for orders placed in visit on 11/19/12  COMPREHENSIVE METABOLIC PANEL      Result Value Range   Sodium 137  135 - 145 mEq/L   Potassium 3.7  3.5 - 5.3 mEq/L   Chloride 102  96 - 112 mEq/L   CO2 27  19 - 32 mEq/L   Glucose, Bld  95  70 - 99 mg/dL   BUN 8  6 - 23 mg/dL   Creat 1.61  0.96 - 0.45 mg/dL   Total Bilirubin 0.9  0.3 - 1.2 mg/dL   Alkaline Phosphatase 89  39 - 117 U/L   AST 14  0 - 37 U/L   ALT 10  0 - 35 U/L   Total Protein 7.8  6.0 - 8.3 g/dL   Albumin 4.5  3.5 - 5.2 g/dL   Calcium 9.9  8.4 - 40.9 mg/dL  TSH      Result Value Range   TSH 1.257  0.350 - 4.500 uIU/mL  POCT INFLUENZA A/B      Result Value Range   Influenza A, POC Negative     Influenza B, POC Negative    POCT CBC      Result Value Range   WBC 8.4  4.6 - 10.2 K/uL   Lymph, poc 0.8  0.6 - 3.4   POC LYMPH PERCENT 10.0  10 - 50 %L   MID (cbc) 0.3  0 - 0.9   POC MID % 3.9  0 - 12 %M   POC Granulocyte 7.2 (*) 2 - 6.9   Granulocyte percent 86.1 (*) 37 - 80 %G   RBC 4.78  4.04 - 5.48 M/uL   Hemoglobin 13.4  12.2 - 16.2 g/dL   HCT, POC 81.1  91.4 - 47.9 %   MCV 90.1  80 - 97 fL   MCH, POC 28.0  27 - 31.2 pg   MCHC 31.1 (*) 31.8 - 35.4 g/dL   RDW, POC 78.2     Platelet Count, POC 369  142 - 424 K/uL   MPV 7.4  0 - 99.8 fL  POCT GLYCOSYLATED HEMOGLOBIN (HGB A1C)      Result Value Range   Hemoglobin A1C 5.2       EKG/XRAY:   Primary read interpreted by Dr. Conley Rolls at [facility].   ASSESSMENT/PLAN: Encounter Diagnoses  Name Primary?  . Allergic rhinitis Yes  . Multiple food allergies, initial encounter   . Allergic rhinitis, cause unspecified    Will refer to allergist C/w saline nasal sprays and anithistamine Rx Prednisone taper and also refilled fluticasone Currently no sinus tenderness, will call me back prn F/u prn    Mckenzie Snyders PHUONG, DO 02/06/2013 9:15 AM

## 2013-02-06 NOTE — Patient Instructions (Addendum)
Allergies, Generic  Allergies may happen from anything your body is sensitive to. This may be food, medicines, pollens, chemicals, and nearly anything around you in everyday life that produces allergens. An allergen is anything that causes an allergy producing substance. Heredity is often a factor in causing these problems. This means you may have some of the same allergies as your parents.  Food allergies happen in all age groups. Food allergies are some of the most severe and life threatening. Some common food allergies are cow's milk, seafood, eggs, nuts, wheat, and soybeans.  SYMPTOMS    Swelling around the mouth.   An itchy red rash or hives.   Vomiting or diarrhea.   Difficulty breathing.  SEVERE ALLERGIC REACTIONS ARE LIFE-THREATENING.  This reaction is called anaphylaxis. It can cause the mouth and throat to swell and cause difficulty with breathing and swallowing. In severe reactions only a trace amount of food (for example, peanut oil in a salad) may cause death within seconds.  Seasonal allergies occur in all age groups. These are seasonal because they usually occur during the same season every year. They may be a reaction to molds, grass pollens, or tree pollens. Other causes of problems are house dust mite allergens, pet dander, and mold spores. The symptoms often consist of nasal congestion, a runny itchy nose associated with sneezing, and tearing itchy eyes. There is often an associated itching of the mouth and ears. The problems happen when you come in contact with pollens and other allergens. Allergens are the particles in the air that the body reacts to with an allergic reaction. This causes you to release allergic antibodies. Through a chain of events, these eventually cause you to release histamine into the blood stream. Although it is meant to be protective to the body, it is this release that causes your discomfort. This is why you were given anti-histamines to feel better. If you are  unable to pinpoint the offending allergen, it may be determined by skin or blood testing. Allergies cannot be cured but can be controlled with medicine.  Hay fever is a collection of all or some of the seasonal allergy problems. It may often be treated with simple over-the-counter medicine such as diphenhydramine. Take medicine as directed. Do not drink alcohol or drive while taking this medicine. Check with your caregiver or package insert for child dosages.  If these medicines are not effective, there are many new medicines your caregiver can prescribe. Stronger medicine such as nasal spray, eye drops, and corticosteroids may be used if the first things you try do not work well. Other treatments such as immunotherapy or desensitizing injections can be used if all else fails. Follow up with your caregiver if problems continue. These seasonal allergies are usually not life threatening. They are generally more of a nuisance that can often be handled using medicine.  HOME CARE INSTRUCTIONS    If unsure what causes a reaction, keep a diary of foods eaten and symptoms that follow. Avoid foods that cause reactions.   If hives or rash are present:   Take medicine as directed.   You may use an over-the-counter antihistamine (diphenhydramine) for hives and itching as needed.   Apply cold compresses (cloths) to the skin or take baths in cool water. Avoid hot baths or showers. Heat will make a rash and itching worse.   If you are severely allergic:   Following a treatment for a severe reaction, hospitalization is often required for closer follow-up.     use an anaphylaxis kit.  If you have had a severe reaction, always carry your anaphylaxis kit or EpiPen with you. Use this medicine as directed by your caregiver if a severe reaction is occurring. Failure to do so could have a fatal outcome. SEEK  MEDICAL CARE IF:  You suspect a food allergy. Symptoms generally happen within 30 minutes of eating a food.  Your symptoms have not gone away within 2 days or are getting worse.  You develop new symptoms.  You want to retest yourself or your child with a food or drink you think causes an allergic reaction. Never do this if an anaphylactic reaction to that food or drink has happened before. Only do this under the care of a caregiver. SEEK IMMEDIATE MEDICAL CARE IF:   You have difficulty breathing, are wheezing, or have a tight feeling in your chest or throat.  You have a swollen mouth, or you have hives, swelling, or itching all over your body.  You have had a severe reaction that has responded to your anaphylaxis kit or an EpiPen. These reactions may return when the medicine has worn off. These reactions should be considered life threatening. MAKE SURE YOU:   Understand these instructions.  Will watch your condition.  Will get help right away if you are not doing well or get worse. Document Released: 01/27/2003 Document Revised: 01/26/2012 Document Reviewed: 07/03/2008 [facility] Patient Information 2013 [place], [place]. Indoor Allergies House dust often contains a mixture of tiny particles that commonly cause allergic symptoms. These include dust mites, cockroaches, fungi spores (mold) and animal dander.  DUST MITES Dust mites are so tiny that they cannot be seen with the naked eye (microscopic). They are relatives of the spider. They live on mattresses, pillows, bedding, upholstered furniture, carpets and curtains. These tiny creatures feed on skin flakes that people and pets shed daily. They commonly float around in the dust in your home when vacuuming or when bedding is disturbed. The air-born dust mites often cause runny noses and symptoms of asthma. The problems are similar to a pollen allergy. These mites thrive in summer and die in winter. In a warm, humid house, however, they  continue to thrive even in the coldest months. The particles seen floating in a shaft of sunlight include dead dust mites and their waste-products. These waste-products, which are proteins, cause the allergic reaction. Even in the cleanest home, dust mites still exist. This is because typical cleaning methods cannot eliminate many of the dust particles.  COCKROACHES Cockroach allergy is primarily caused by their droppings. It is found in house dust, especially in older homes. MOLD Mold is very often found in homes and house dust, and when in high concentrations may become harmful, especially for people allergic to mold. They tend to grow faster in the presence of moisture. ANIMAL DANDER Pets (furred animals) can cause allergies too. This is not caused by the fur, but from the proteins in their skin, saliva and urine. These proteins are called allergens. The dander (skin scales) is the source of most pet allergies. Therefore, short-haired animals can cause allergies as much as Soil scientist. Dander and saliva are the source of cat and dog allergens. Urine is the source of allergens from rabbits, hamsters, mice and [place] pigs. PREVENTION Dust mites  Use a dehumidifier or air conditioner to keep the humidity low (50% or below).  Cover your mattress and pillows in dust-proof or allergen resistant covers.  Wash all bedding and blankets once a week  in hot water (at least 130 - 140F). Non-washable bedding can be frozen overnight to kill dust mites.  Replace wool or feathered bedding with synthetic materials and traditional stuffed animals with washable ones.  If possible, replace wall-to-wall carpets in bedrooms with bare floors (linoleum, tile or wood). Remove fabric curtains and upholstered furniture.  Use a damp mop or rag to remove dust. Do not use a dry cloth since this stirs up mite allergens.  Use a vacuum cleaner with either a double-layered micro filter bag or a HEPA filter. These  filters trap allergens that pass through a vacuum's exhaust.  Wear a mask while vacuuming to avoid inhaling allergens. Stay out of the vacuumed area for 20 minutes while dust and allergens settle.  Use only high efficiency media filters for your furnace and air-conditioning, preferably with a MERV rating of 11 or 12. In order to maintain a clean filter, remember to change it at least every three months. Cockroaches  Control cockroaches by eliminating their entrance to the home and by eliminating their food sources.  Block crevices and cracks and remove water sources such as leaky faucets and pipes.  Keep food out of the open when finished eating. This also includes pet food. Sealed containers for food work well. Remove crumbs that may have accumulated such as in a toaster.  Use garbage containers that have lids and immediately clean off counters, tables and stove tops.  An exterminator might be helpful as well. Mold  Control mold by eliminating moisture and dampness.  Repair leaks around the home including the roof and pipes.  For high humid areas consider using dehumidifiers. Rooms with the most moisture include kitchens, bathrooms and basements.  Ventilation and cleaning are also important.  Detergent or 5% bleach can be used to clean off mold from hard surfaces. It is important not to mix bleach with other products and to dry the area completely after cleaning.  For more extensive mold problems hire an Gaffer.  For mold on clothing, soap and water work best. If they cannot be cleaned throw them out. Animal dander  Control pet dander by removing pets from your home. If this is not an option then try lessen the contact by keeping the pet out of areas that you spend most of your time, such as the bedroom.  Vacuum often and consider replacing carpet with a hardwood floor, tile or linoleum.  A HEPA air cleaner may also help to reduce the level of animal  allergen in the air. Document Released: 10/04/2004 Document Revised: 01/26/2012 Document Reviewed: 02/13/2009 [facility] Patient Information 2013 [place], [place].

## 2013-12-10 ENCOUNTER — Encounter (HOSPITAL_COMMUNITY): Payer: Self-pay | Admitting: Emergency Medicine

## 2013-12-10 ENCOUNTER — Emergency Department (HOSPITAL_COMMUNITY)
Admission: EM | Admit: 2013-12-10 | Discharge: 2013-12-10 | Disposition: A | Discharge status: Home / Self Care | Payer: BC Managed Care – PPO | Attending: Emergency Medicine | Admitting: Emergency Medicine

## 2013-12-10 DIAGNOSIS — T7840XA Allergy, unspecified, initial encounter: Secondary | ICD-10-CM

## 2013-12-10 DIAGNOSIS — Z79899 Other long term (current) drug therapy: Secondary | ICD-10-CM | POA: Insufficient documentation

## 2013-12-10 DIAGNOSIS — Z8742 Personal history of other diseases of the female genital tract: Secondary | ICD-10-CM | POA: Insufficient documentation

## 2013-12-10 DIAGNOSIS — R209 Unspecified disturbances of skin sensation: Secondary | ICD-10-CM | POA: Insufficient documentation

## 2013-12-10 DIAGNOSIS — L272 Dermatitis due to ingested food: Secondary | ICD-10-CM | POA: Insufficient documentation

## 2013-12-10 DIAGNOSIS — I10 Essential (primary) hypertension: Secondary | ICD-10-CM | POA: Insufficient documentation

## 2013-12-10 MED ORDER — EPINEPHRINE 0.3 MG/0.3ML IJ SOAJ: 0.3000 mg | INTRAMUSCULAR | Status: DC | PRN

## 2013-12-10 MED ORDER — DIPHENHYDRAMINE HCL 50 MG/ML IJ SOLN
25.0000 mg | Freq: Once | INTRAMUSCULAR | Status: AC
  Administered 2013-12-10: 25 mg via INTRAVENOUS
  Filled 2013-12-10: qty 1

## 2013-12-10 MED ORDER — FAMOTIDINE IN NACL 20-0.9 MG/50ML-% IV SOLN
20.0000 mg | Freq: Once | INTRAVENOUS | Status: AC
  Administered 2013-12-10: 20 mg via INTRAVENOUS
  Filled 2013-12-10: qty 50

## 2013-12-10 MED ORDER — METHYLPREDNISOLONE SODIUM SUCC 125 MG IJ SOLR
125.0000 mg | Freq: Once | INTRAMUSCULAR | Status: AC
Start: 2013-12-10 — End: 2013-12-10
  Administered 2013-12-10: 125 mg via INTRAVENOUS
  Filled 2013-12-10: qty 2

## 2013-12-10 NOTE — ED Notes (Signed)
Pt sts "i feel better." Pt denies itching, tingling, pain. Pt sts "I feel back to normal now, just sleepy."

## 2013-12-10 NOTE — ED Provider Notes (Signed)
Medical screening examination/treatment/procedure(s) were performed by non-physician practitioner and as supervising physician I was immediately available for consultation/collaboration.  EKG Interpretation   None        Threasa Beards, MD 12/10/13 2316

## 2013-12-10 NOTE — ED Provider Notes (Signed)
CSN: [id number]     Arrival date & time 12/10/13  2037 History   First MD Initiated Contact with Patient 12/10/13 2043     Chief Complaint  Patient presents with  . Allergic Reaction   (Consider location/radiation/quality/duration/timing/severity/associated sxs/prior Treatment) HPI Comments: Pt is a 48 y/o female who presents to the ED after having an allergic reaction to lobster 30 minutes prior to arrival. Pt states she has an allergy to shellfish, has not had a severe reaction in a couple years, is occasionally able to eat a small amount of shellfish. Normally able to eat lobster without any problem. After eating the lobster she began to feel itchy on the top of her head, her tongue began tingling and then her entire body started itching. She noticed a rash starting on her chest and face. Denies facial swelling, sob, chest or throat tightness. She used her EpiPen and states itching started to improve, rash beginning to subside.  Patient is a 48 y.o. female presenting with allergic reaction. The history is provided by the patient and the spouse.  Allergic Reaction Presenting symptoms: rash     Past Medical History  Diagnosis Date  . Hypertension   . Allergy   . Fibroids   . Dysmenorrhea    Past Surgical History  Procedure Laterality Date  . Laser ablation of the cervix    . Myomectomy     Family History  Problem Relation Age of Onset  . Alzheimer's disease Mother   . Hypertension Mother   . Diabetes Father   . Hypertension Father   . COPD Brother   . Heart attack Sister    History  Substance Use Topics  . Smoking status: Never Smoker   . Smokeless tobacco: Not on file  . Alcohol Use: Yes   OB History   Grav Para Term Preterm Abortions TAB SAB Ect Mult Living                 Review of Systems  Skin: Positive for rash.       Positive for pruritis.  All other systems reviewed and are negative.    Allergies  Shellfish allergy  Home Medications   Current  Outpatient Rx  Name  Route  Sig  Dispense  Refill  . cetirizine (ZYRTEC) 10 MG tablet   Oral   Take 10 mg by mouth daily.         Marland Kitchen EPINEPHrine (EPIPEN) 0.3 mg/0.3 mL DEVI   Intramuscular   Inject 0.3 mLs (0.3 mg total) into the muscle as needed.   2 Device   0   . lisinopril-hydrochlorothiazide (PRINZIDE,ZESTORETIC) 10-12.5 MG per tablet   Oral   Take 1 tablet by mouth daily.   30 tablet   5   . pseudoephedrine (SUDAFED) 60 MG tablet   Oral   Take 60 mg by mouth every 4 (four) hours as needed for congestion.         Marland Kitchen EPINEPHrine (EPIPEN) 0.3 mg/0.3 mL SOAJ injection   Intramuscular   Inject 0.3 mLs (0.3 mg total) into the muscle as needed.   2 Device   1    BP 128/76  Pulse 88  Resp 23  SpO2 99%  LMP 11/26/2013 Physical Exam  Nursing note and vitals reviewed. Constitutional: She is oriented to person, place, and time. She appears well-developed and well-nourished. No distress.  HENT:  Head: Normocephalic and atraumatic.  Mouth/Throat: Oropharynx is clear and moist.  No facial swelling or angioedema.  Eyes: Conjunctivae and EOM are normal. Pupils are equal, round, and reactive to light.  Neck: Normal range of motion. Neck supple. No tracheal deviation present.  Cardiovascular: Normal rate, regular rhythm, normal heart sounds and intact distal pulses.   Pulmonary/Chest: Effort normal and breath sounds normal. No stridor. No respiratory distress. She has no wheezes.  Abdominal: Soft. Bowel sounds are normal. There is no tenderness.  Musculoskeletal: Normal range of motion. She exhibits no edema.  Neurological: She is alert and oriented to person, place, and time.  Skin: Skin is warm and dry. She is not diaphoretic.  Few urticaria anterior chest.  Psychiatric: She has a normal mood and affect. Her behavior is normal.    ED Course  Procedures (including critical care time) Labs Review Labs Reviewed - No data to display Imaging Review No results found.  EKG  Interpretation   None       MDM   1. Allergic reaction     Pt presenting after allergic reaction to shellfish. She is well appearing and in NAD. No respiratory or airway compromise. No facial swelling or angioedema. She had EpiPen prior to arrival. Plan to monitor patient, give Solu-Medrol, Benadryl and Pepcid. 11:13 PM Pt reports her symptoms have not returned and she is feeling find. Urticaria subsided. No facial swelling. No respiratory or airway compromise. She is stable for discharge home. Rx for epi-pen given. Return precautions given. Patient states understanding of treatment care plan and is agreeable.   Illene Labrador, PA-C 12/10/13 2314

## 2013-12-10 NOTE — Discharge Instructions (Signed)
It is suggested for you to no longer eat shellfish.  Allergies Allergies may happen from anything your body is sensitive to. This may be food, medicines, pollens, chemicals, and nearly anything around you in everyday life that produces allergens. An allergen is anything that causes an allergy producing substance. Heredity is often a factor in causing these problems. This means you may have some of the same allergies as your parents. Food allergies happen in all age groups. Food allergies are some of the most severe and life threatening. Some common food allergies are cow's milk, seafood, eggs, nuts, wheat, and soybeans. SYMPTOMS   Swelling around the mouth.  An itchy red rash or hives.  Vomiting or diarrhea.  Difficulty breathing. SEVERE ALLERGIC REACTIONS ARE LIFE-THREATENING. This reaction is called anaphylaxis. It can cause the mouth and throat to swell and cause difficulty with breathing and swallowing. In severe reactions only a trace amount of food (for example, peanut oil in a salad) may cause death within seconds. Seasonal allergies occur in all age groups. These are seasonal because they usually occur during the same season every year. They may be a reaction to molds, grass pollens, or tree pollens. Other causes of problems are house dust mite allergens, pet dander, and mold spores. The symptoms often consist of nasal congestion, a runny itchy nose associated with sneezing, and tearing itchy eyes. There is often an associated itching of the mouth and ears. The problems happen when you come in contact with pollens and other allergens. Allergens are the particles in the air that the body reacts to with an allergic reaction. This causes you to release allergic antibodies. Through a chain of events, these eventually cause you to release histamine into the blood stream. Although it is meant to be protective to the body, it is this release that causes your discomfort. This is why you were given  anti-histamines to feel better. If you are unable to pinpoint the offending allergen, it may be determined by skin or blood testing. Allergies cannot be cured but can be controlled with medicine. Hay fever is a collection of all or some of the seasonal allergy problems. It may often be treated with simple over-the-counter medicine such as diphenhydramine. Take medicine as directed. Do not drink alcohol or drive while taking this medicine. Check with your caregiver or package insert for child dosages. If these medicines are not effective, there are many new medicines your caregiver can prescribe. Stronger medicine such as nasal spray, eye drops, and corticosteroids may be used if the first things you try do not work well. Other treatments such as immunotherapy or desensitizing injections can be used if all else fails. Follow up with your caregiver if problems continue. These seasonal allergies are usually not life threatening. They are generally more of a nuisance that can often be handled using medicine. HOME CARE INSTRUCTIONS   If unsure what causes a reaction, keep a diary of foods eaten and symptoms that follow. Avoid foods that cause reactions.  If hives or rash are present:  Take medicine as directed.  You may use an over-the-counter antihistamine (diphenhydramine) for hives and itching as needed.  Apply cold compresses (cloths) to the skin or take baths in cool water. Avoid hot baths or showers. Heat will make a rash and itching worse.  If you are severely allergic:  Following a treatment for a severe reaction, hospitalization is often required for closer follow-up.  Wear a medic-alert bracelet or necklace stating the allergy.  You  and your family must learn how to give adrenaline or use an anaphylaxis kit.  If you have had a severe reaction, always carry your anaphylaxis kit or EpiPen with you. Use this medicine as directed by your caregiver if a severe reaction is occurring. Failure  to do so could have a fatal outcome. SEEK MEDICAL CARE IF:  You suspect a food allergy. Symptoms generally happen within 30 minutes of eating a food.  Your symptoms have not gone away within 2 days or are getting worse.  You develop new symptoms.  You want to retest yourself or your child with a food or drink you think causes an allergic reaction. Never do this if an anaphylactic reaction to that food or drink has happened before. Only do this under the care of a caregiver. SEEK IMMEDIATE MEDICAL CARE IF:   You have difficulty breathing, are wheezing, or have a tight feeling in your chest or throat.  You have a swollen mouth, or you have hives, swelling, or itching all over your body.  You have had a severe reaction that has responded to your anaphylaxis kit or an EpiPen. These reactions may return when the medicine has worn off. These reactions should be considered life threatening. MAKE SURE YOU:   Understand these instructions.  Will watch your condition.  Will get help right away if you are not doing well or get worse. Document Released: 01/27/2003 Document Revised: 02/28/2013 Document Reviewed: 07/03/2008 [facility] Patient Information 2014 [place].  Food Allergy A food allergy occurs from eating something you are sensitive to. Food allergies occur in all age groups. It may be passed to you from your parents (heredity).  CAUSES  Some common causes are cow's milk, seafood, eggs, nuts (including peanut butter), wheat, and soybeans. SYMPTOMS  Common problems are:   Swelling around the mouth.  An itchy, red rash.  Hives.  Vomiting.  Diarrhea. Severe allergic reactions are life-threatening. This reaction is called anaphylaxis. It can cause the mouth and throat to swell. This makes it hard to breathe and swallow. In severe reactions, only a small amount of food may be fatal within seconds. HOME CARE INSTRUCTIONS   If you are unsure what caused the reaction, keep a  diary of foods eaten and symptoms that followed. Avoid foods that cause reactions.  If hives or rash are present:  Take medicines as directed.  Use an over-the-counter antihistamine (diphenhydramine) to treat hives and itching as needed.  Apply cold compresses to the skin or take baths in cool water. Avoid hot baths or showers. These will increase the redness and itching.  If you are severely allergic:  Hospitalization is often required following a severe reaction.  Wear a medical alert bracelet or necklace that describes the allergy.  Carry your anaphylaxis kit or epinephrine injection with you at all times. Both you and your family members should know how to use this. This can be lifesaving if you have a severe reaction. If epinephrine is used, it is important for you to seek immediate medical care or call your local emergency services (911 in [place] When the epinephrine wears off, it can be followed by a delayed reaction, which can be fatal.  Replace your epinephrine immediately after use in case of another reaction.  Ask your caregiver for instructions if you have not been taught how to use an epinephrine injection.  Do not drive until medicines used to treat the reaction have worn off, unless approved by your caregiver. [place]  IF:   You suspect a food allergy. Symptoms generally happen within 30 minutes of eating a food.  Your symptoms have not gone away within 2 days. See your caregiver sooner if symptoms are getting worse.  You develop new symptoms.  You want to retest yourself with a food or drink you think causes an allergic reaction. Never do this if an anaphylactic reaction to that food or drink has happened before.  There is a return of the symptoms which brought you to your caregiver. SEEK IMMEDIATE MEDICAL CARE IF:   You have trouble breathing, are wheezing, or you have a tight feeling in your chest or throat.  You have a swollen mouth, or you have  hives, swelling, or itching all over your body. Use your epinephrine injection immediately. This is given into the outside of your thigh, deep into the muscle. Following use of the epinephrine injection, seek help right away. Seek immediate medical care or call your local emergency services (911 in [place] MAKE SURE YOU:   Understand these instructions.  Will watch your condition.  Will get help right away if you are not doing well or get worse. Document Released: 10/31/2000 Document Revised: 01/26/2012 Document Reviewed: 06/22/2008 [facility] Patient Information 2014 [place].  Epinephrine Injection Epinephrine is a medicine given by injection to temporarily treat an emergency allergic reaction. It is also used to treat severe asthmatic attacks and other lung problems. The medicine helps to enlarge (dilate) the small breathing tubes of the lungs. A life-threatening, sudden allergic reaction that involves the whole body is called anaphylaxis. Because of potential side effects, epinephrine should only be used as directed by your caregiver. RISKS AND COMPLICATIONS Possible side effects of epinephrine injections include:  Chest pain.  Irregular or rapid heartbeat.  Shortness of breath.  Nausea.  Vomiting.  Abdominal pain or cramping.  Sweating.  Dizziness.  Weakness.  Headache.  Nervousness. Report all side effects to your caregiver. HOW TO GIVE AN EPINEPHRINE INJECTION Give the epinephrine injection immediately when symptoms of a severe reaction begin. Inject the medicine into the outer thigh or any available, large muscle. Your caregiver can teach you how to do this. You do not need to remove any clothing. After the injection, call your local emergency services (911 in [place] Even if you improve after the injection, you need to be examined at a hospital emergency department. Epinephrine works quickly, but it also wears off quickly. Delayed reactions can occur. A delayed  reaction may be as serious and dangerous as the initial reaction. HOME CARE INSTRUCTIONS  Make sure you and your family know how to give an epinephrine injection.  Use epinephrine injections as directed by your caregiver. Do not use this medicine more often or in larger doses than prescribed.  Always carry your epinephrine injection or anaphylaxis kit with you. This can be lifesaving if you have a severe reaction.  Store the medicine in a cool, dry place. If the medicine becomes discolored or cloudy, dispose of it properly and replace it with new medicine.  Check the expiration date on your medicine. It may be unsafe to use medicines past their expiration date.  Tell your caregiver about any other medicines you are taking. Some medicines can react badly with epinephrine.  Tell your caregiver about any medical conditions you have, such as diabetes, high blood pressure (hypertension), heart disease, irregular heartbeats, or if you are pregnant. SEEK IMMEDIATE MEDICAL CARE IF:  You have used an epinephrine injection. Call your local emergency  services (911 in [place] Even if you improve after the injection, you need to be examined at a hospital emergency department to make sure your allergic reaction is under control. You will also be monitored for adverse effects from the medicine.  You have chest pain.  You have irregular or fast heartbeats.  You have shortness of breath.  You have severe headaches.  You have severe nausea, vomiting, or abdominal cramps.  You have severe pain, swelling, or redness in the area where you gave the injection. Document Released: 10/31/2000 Document Revised: 01/26/2012 Document Reviewed: 07/23/2011 [facility] Patient Information 2014 [place], [place].

## 2013-12-10 NOTE — ED Notes (Signed)
Pt is allergic to shellfish and ate lobster about 30 minutes ago. Pt c/o tongue tingling and itching over entire body. Pt c/o eye burning.. Pt denies SOB and throat tightness. Pt used her own epipen. A&Ox4.

## 2013-12-11 ENCOUNTER — Observation Stay (HOSPITAL_COMMUNITY)
Admission: EM | Admit: 2013-12-11 | Discharge: 2013-12-11 | Disposition: A | Discharge status: Home / Self Care | Payer: BC Managed Care – PPO | Attending: Internal Medicine | Admitting: Internal Medicine

## 2013-12-11 ENCOUNTER — Encounter (HOSPITAL_COMMUNITY): Payer: Self-pay | Admitting: Emergency Medicine

## 2013-12-11 DIAGNOSIS — R220 Localized swelling, mass and lump, head: Secondary | ICD-10-CM | POA: Insufficient documentation

## 2013-12-11 DIAGNOSIS — R7309 Other abnormal glucose: Secondary | ICD-10-CM | POA: Insufficient documentation

## 2013-12-11 DIAGNOSIS — L272 Dermatitis due to ingested food: Principal | ICD-10-CM | POA: Insufficient documentation

## 2013-12-11 DIAGNOSIS — R221 Localized swelling, mass and lump, neck: Secondary | ICD-10-CM

## 2013-12-11 DIAGNOSIS — I10 Essential (primary) hypertension: Secondary | ICD-10-CM | POA: Diagnosis present

## 2013-12-11 DIAGNOSIS — T7840XA Allergy, unspecified, initial encounter: Secondary | ICD-10-CM | POA: Diagnosis present

## 2013-12-11 LAB — BASIC METABOLIC PANEL
BUN: 21 mg/dL (ref 6–23)
CO2: 22 meq/L (ref 19–32)
Calcium: 9.4 mg/dL (ref 8.4–10.5)
Chloride: 99 mEq/L (ref 96–112)
Creatinine, Ser: 0.7 mg/dL (ref 0.50–1.10)
GFR calc Af Amer: >90 mL/min (ref >90)
GLUCOSE: 219 mg/dL — AB (ref 70–99)
POTASSIUM: 4 meq/L (ref 3.7–5.3)
SODIUM: 134 meq/L — AB (ref 137–147)

## 2013-12-11 LAB — CBC
HCT: 36.4 % (ref 36.0–46.0)
HEMOGLOBIN: 12.1 g/dL (ref 12.0–15.0)
MCH: 28.2 pg (ref 26.0–34.0)
MCHC: 33.2 g/dL (ref 30.0–36.0)
MCV: 84.8 fL (ref 78.0–100.0)
Platelets: 320 10*3/uL (ref 150–400)
RBC: 4.29 MIL/uL (ref 3.87–5.11)
RDW: 13.6 % (ref 11.5–15.5)
WBC: 10.8 10*3/uL — ABNORMAL HIGH (ref 4.0–10.5)

## 2013-12-11 LAB — GLUCOSE, CAPILLARY
GLUCOSE-CAPILLARY: 143 mg/dL — AB (ref 70–99)
Glucose-Capillary: 191 mg/dL — ABNORMAL HIGH (ref 70–99)

## 2013-12-11 LAB — HEMOGLOBIN A1C
Hgb A1c MFr Bld: 5.6 % (ref <5.7)
Mean Plasma Glucose: 114 mg/dL (ref <117)

## 2013-12-11 MED ORDER — ONDANSETRON HCL 4 MG/2ML IJ SOLN: 4.0000 mg | Freq: Four times a day (QID) | INTRAMUSCULAR | Status: DC | PRN

## 2013-12-11 MED ORDER — INSULIN ASPART 100 UNIT/ML ~~LOC~~ SOLN
0.0000 [IU] | Freq: Three times a day (TID) | SUBCUTANEOUS | Status: DC
Start: 2013-12-11 — End: 2013-12-11

## 2013-12-11 MED ORDER — DIPHENHYDRAMINE HCL 50 MG/ML IJ SOLN: 25.0000 mg | Freq: Four times a day (QID) | INTRAMUSCULAR | Status: DC | PRN

## 2013-12-11 MED ORDER — AMLODIPINE BESYLATE 10 MG PO TABS: 10.0000 mg | ORAL_TABLET | Freq: Every day | ORAL | Status: DC

## 2013-12-11 MED ORDER — SODIUM CHLORIDE 0.9 % IV SOLN
INTRAVENOUS | Status: DC
  Administered 2013-12-11: 10:00:00 via INTRAVENOUS

## 2013-12-11 MED ORDER — ONDANSETRON HCL 4 MG PO TABS: 4.0000 mg | ORAL_TABLET | Freq: Four times a day (QID) | ORAL | Status: DC | PRN

## 2013-12-11 MED ORDER — FAMOTIDINE IN NACL 20-0.9 MG/50ML-% IV SOLN
20.0000 mg | Freq: Two times a day (BID) | INTRAVENOUS | Status: DC
  Administered 2013-12-11: 20 mg via INTRAVENOUS
  Filled 2013-12-11 (×2): qty 50

## 2013-12-11 MED ORDER — HYDRALAZINE HCL 20 MG/ML IJ SOLN: 10.0000 mg | INTRAMUSCULAR | Status: DC | PRN

## 2013-12-11 MED ORDER — LORATADINE 10 MG PO TABS
10.0000 mg | ORAL_TABLET | Freq: Every day | ORAL | Status: DC
  Administered 2013-12-11: 10 mg via ORAL
  Filled 2013-12-11: qty 1

## 2013-12-11 MED ORDER — ACETAMINOPHEN 650 MG RE SUPP: 650.0000 mg | Freq: Four times a day (QID) | RECTAL | Status: DC | PRN

## 2013-12-11 MED ORDER — DIPHENHYDRAMINE HCL 25 MG PO TABS: 25.0000 mg | ORAL_TABLET | Freq: Four times a day (QID) | ORAL | Status: AC | PRN

## 2013-12-11 MED ORDER — ENOXAPARIN SODIUM 40 MG/0.4ML ~~LOC~~ SOLN
40.0000 mg | SUBCUTANEOUS | Status: DC
  Filled 2013-12-11: qty 0.4

## 2013-12-11 MED ORDER — METHYLPREDNISOLONE SODIUM SUCC 125 MG IJ SOLR
125.0000 mg | Freq: Once | INTRAMUSCULAR | Status: AC
  Administered 2013-12-11: 125 mg via INTRAVENOUS
  Filled 2013-12-11: qty 2

## 2013-12-11 MED ORDER — ACETAMINOPHEN 325 MG PO TABS: 650.0000 mg | ORAL_TABLET | Freq: Four times a day (QID) | ORAL | Status: DC | PRN

## 2013-12-11 MED ORDER — PREDNISONE 5 MG PO TABS: 5.0000 mg | ORAL_TABLET | Freq: Every day | ORAL | Status: DC

## 2013-12-11 MED ORDER — FAMOTIDINE 20 MG PO TABS: 20.0000 mg | ORAL_TABLET | Freq: Two times a day (BID) | ORAL | Status: DC

## 2013-12-11 NOTE — H&P (Signed)
[facility] History and Physical  Mckenzie Hill ZDG:[id number] DOB: October 12, 1966 DOA: 12/11/2013  Referring physician: ER physician. PCP: Kennon Portela, MD   Chief Complaint: Itching and facial swelling.  HPI: Mckenzie Hill is a 48 y.o. female with history of allergy to shellfish, hypertension started noticing itching of her tongue then rash over the body last evening after having lobster. Patient immediately had her EpiPen injected and presented to the ER. In the ER patient was given Solu-Medrol and Benadryl and was observed and discharged home. At around 12 midnight patient started developing facial swelling and rash again following which he came to the ER again after administering EpiPen. In the ER patient was given another dose of Solu-Medrol and at this time has been admitted for observation. Patient at this time on my exam has minimal swelling around the eyes otherwise has no facial swelling or any leg swelling. Denies any difficulty breathing or swallowing. Patient denies any use of any new medications perfumes or detergents.   Review of Systems: As presented in the history of presenting illness, rest negative.  Past Medical History  Diagnosis Date  . Hypertension   . Allergy   . Fibroids   . Dysmenorrhea    Past Surgical History  Procedure Laterality Date  . Laser ablation of the cervix    . Myomectomy     Social History:  reports that she has never smoked. She does not have any smokeless tobacco history on file. She reports that she drinks alcohol. She reports that she does not use illicit drugs. Where does patient live home. Can patient participate in ADLs? Yes.  Allergies  Allergen Reactions  . Shellfish Allergy Anaphylaxis, Hives and Itching    Family History:  Family History  Problem Relation Age of Onset  . Alzheimer's disease Mother   . Hypertension Mother   . Diabetes Father   . Hypertension Father   . COPD Brother   . Heart attack Sister       Prior  to Admission medications   Medication Sig Start Date End Date Taking? Authorizing Provider  cetirizine (ZYRTEC) 10 MG tablet Take 10 mg by mouth daily.   Yes Historical Provider, MD  EPINEPHrine (EPIPEN) 0.3 mg/0.3 mL DEVI Inject 0.3 mLs (0.3 mg total) into the muscle as needed. 01/05/12  Yes Garald Balding, NP  EPINEPHrine (EPIPEN) 0.3 mg/0.3 mL SOAJ injection Inject 0.3 mLs (0.3 mg total) into the muscle as needed. 12/10/13  Yes Illene Labrador, PA-C  lisinopril-hydrochlorothiazide (PRINZIDE,ZESTORETIC) 10-12.5 MG per tablet Take 1 tablet by mouth daily. 11/19/12  Yes Wardell Honour, MD  pseudoephedrine (SUDAFED) 60 MG tablet Take 60 mg by mouth every 4 (four) hours as needed for congestion.   Yes Historical Provider, MD    Physical Exam: Filed Vitals:   12/11/13 0049 12/11/13 0301 12/11/13 0500  BP: 138/61 143/76   Pulse: 110 97 102  Temp: 98.5 F (36.9 C) 98.2 F (36.8 C)   TempSrc: Oral Oral   Resp: 20 16 16   SpO2: 96% 98% 98%     General:  Well-developed well-nourished.  Eyes: Anicteric no pallor. Mild periorbital swelling.  ENT: No discharge from ears eyes nose mouth.  Neck: No mass felt. No stridor.  Cardiovascular: S1-S2 heard.  Respiratory: No rhonchi no crepitations.  Abdomen: Soft nontender bowel sounds present.  Skin: No rash.  Musculoskeletal: No edema.  Psychiatric: Appears normal.  Neurologic: Alert awake oriented to time place and person. Moves all extremities.  Labs on  Admission:  Basic Metabolic Panel:  Recent Labs Lab 12/11/13 0317  NA 134*  K 4.0  CL 99  CO2 22  GLUCOSE 219*  BUN 21  CREATININE 0.70  CALCIUM 9.4   Liver Function Tests: No results found for this basename: AST, ALT, ALKPHOS, BILITOT, PROT, ALBUMIN,  in the last 168 hours No results found for this basename: LIPASE, AMYLASE,  in the last 168 hours No results found for this basename: AMMONIA,  in the last 168 hours CBC:  Recent Labs Lab 12/11/13 0317  WBC 10.8*  HGB 12.1   HCT 36.4  MCV 84.8  PLT 320   Cardiac Enzymes: No results found for this basename: CKTOTAL, CKMB, CKMBINDEX, TROPONINI,  in the last 168 hours  BNP (last 3 results) No results found for this basename: PROBNP,  in the last 8760 hours CBG: No results found for this basename: GLUCAP,  in the last 168 hours  Radiological Exams on Admission: No results found.   Assessment/Plan Principal Problem:   Allergic reaction Active Problems:   HTN (hypertension)   1. Allergic reaction most likely to lobster with known history of shellfish allergy - patient's initial episode looks more like urticarial reaction but the second one is more like angioedema. At this time given the anterior reaction I have held patient's lisinopril. Patient did receive second dose of Solu-Medrol already. I have placed patient on Pepcid and Benadryl. Closely observe until evening. Patient presently is hemodynamically stable and not in any acute respiratory distress. 2. Hypertension - since patient has angioedema-like reaction I am holding patient's lisinopril and placing patient on when necessary IV hydralazine for systolic blood pressure more than 160. 3. Hyperglycemia - probably secondary to steroids. Check hemoglobin A1c.  I have reviewed patient's old charts.  Code Status: Full code.  Family Communication: Patient's husband at the bedside.  Disposition Plan: Admit for observation.    Azoria Hill N. [facility] Pager [phone].  If 7PM-7AM, please contact night-coverage [email or web address] Password [email or web address] 12/11/2013, 6:18 AM

## 2013-12-11 NOTE — ED Notes (Signed)
Per pt report: pt was seen early today for same complaint.  Pt ate lobster and began having an allergic reaction.  Pt was discharged at around 23:30.  Pt got home and symptoms began again.  Pt used another epi pen prior to arrival.  Pt reports throat is tickly and itchy but not having a hard time swallowing.  Pt's hands are red.  Lung sounds clear.  Pt's husband reports that pt's voice does sound slightly "distorted."  Pt a/o x 4.

## 2013-12-11 NOTE — Discharge Instructions (Signed)
Food Allergy °A food allergy occurs from eating something you are sensitive to. Food allergies occur in all age groups. It may be passed to you from your parents (heredity).  °CAUSES  °Some common causes are cow's milk, seafood, eggs, nuts (including peanut butter), wheat, and soybeans. °SYMPTOMS  °Common problems are:  °· Swelling around the mouth. °· An itchy, red rash. °· Hives. °· Vomiting. °· Diarrhea. °Severe allergic reactions are life-threatening. This reaction is called anaphylaxis. It can cause the mouth and throat to swell. This makes it hard to breathe and swallow. In severe reactions, only a small amount of food may be fatal within seconds. °HOME CARE INSTRUCTIONS  °· If you are unsure what caused the reaction, keep a diary of foods eaten and symptoms that followed. Avoid foods that cause reactions. °· If hives or rash are present: °· Take medicines as directed. °· Use an over-the-counter antihistamine (diphenhydramine) to treat hives and itching as needed. °· Apply cold compresses to the skin or take baths in cool water. Avoid hot baths or showers. These will increase the redness and itching. °· If you are severely allergic: °· Hospitalization is often required following a severe reaction. °· Wear a medical alert bracelet or necklace that describes the allergy. °· Carry your anaphylaxis kit or epinephrine injection with you at all times. Both you and your family members should know how to use this. This can be lifesaving if you have a severe reaction. If epinephrine is used, it is important for you to seek immediate medical care or call your local emergency services (911 in [place] When the epinephrine wears off, it can be followed by a delayed reaction, which can be fatal. °· Replace your epinephrine immediately after use in case of another reaction. °· Ask your caregiver for instructions if you have not been taught how to use an epinephrine injection. °· Do not drive until medicines used to treat the  reaction have worn off, unless approved by your caregiver. °SEEK MEDICAL CARE IF:  °· You suspect a food allergy. Symptoms generally happen within 30 minutes of eating a food. °· Your symptoms have not gone away within 2 days. See your caregiver sooner if symptoms are getting worse. °· You develop new symptoms. °· You want to retest yourself with a food or drink you think causes an allergic reaction. Never do this if an anaphylactic reaction to that food or drink has happened before. °· There is a return of the symptoms which brought you to your caregiver. °SEEK IMMEDIATE MEDICAL CARE IF:  °· You have trouble breathing, are wheezing, or you have a tight feeling in your chest or throat. °· You have a swollen mouth, or you have hives, swelling, or itching all over your body. Use your epinephrine injection immediately. This is given into the outside of your thigh, deep into the muscle. Following use of the epinephrine injection, seek help right away. °Seek immediate medical care or call your local emergency services (911 in [place] °MAKE SURE YOU:  °· Understand these instructions. °· Will watch your condition. °· Will get help right away if you are not doing well or get worse. °Document Released: 10/31/2000 Document Revised: 01/26/2012 Document Reviewed: 06/22/2008 °[employer]® Patient Information ©2014 [employer]. ° °

## 2013-12-11 NOTE — Discharge Summary (Signed)
Physician Discharge Summary  Mckenzie Hill CBJ:[id number] DOB: 03-Sep-1966 DOA: 12/11/2013  PCP: Kennon Portela, MD  Admit date: 12/11/2013 Discharge date: 12/11/2013  Recommendations for Outpatient Follow-up:  1. Please continue prednisone taper 50 mg a day and taper down by 5 mg a day down to 0 mg and then stop (i.e. 50 mg tday, 45 mg tomorrow, then 40 mg the following day etc...) 2. Take Pepcid while on prednisone to protect gastric mucosa 3. Take benadryl as needed for itching 4. You are on lisinopril and have reported cough; this may be a side effect so we stopped lisinopril and started Norvasc 10 mg daily for better BP control  Discharge Diagnoses:  Principal Problem:   Allergic reaction Active Problems:   HTN (hypertension)    Discharge Condition: medically stable for discharge home today   Diet recommendation: as tolerated   History of present illness:  Pt admitted for allergic reaction after eating lobster. She was given 1 dose of solumedrol in ED and has improved significantly. Facial swelling and hives almost resolved completely. She was never given epipen.  Hospital Course:   Principal Problem:   Allergic reaction - to red lobster - continue prednisone taper at home as indicated above - also may use PO PRN benadryl - Pepcid 20 mg 1-2 times a day while on steroids Active Problems:   HTN (hypertension) - due to reports of cough, lisinopril stopped - added Norvasc for BP control   Signed:  Leisa Lenz, MD  [facility] 12/11/2013, 1:57 PM  Pager #: [phone]  Procedures:  None   Consultations:  None   Discharge Exam: Filed Vitals:   12/11/13 0810  BP: 122/78  Pulse: 83  Temp: 98.2 F (36.8 C)  Resp: 20   Filed Vitals:   12/11/13 0049 12/11/13 0301 12/11/13 0500 12/11/13 0810  BP: 138/61 143/76  122/78  Pulse: 110 97 102 83  Temp: 98.5 F (36.9 C) 98.2 F (36.8 C)  98.2 F (36.8 C)  TempSrc: Oral Oral  Oral  Resp: 20 16 16 20    Height:    5\' 1"  (1.549 m)  Weight:    86.2 kg (190 lb 0.6 oz)  SpO2: 96% 98% 98% 99%    General: Pt is alert, follows commands appropriately, not in acute distress Cardiovascular: Regular rate and rhythm, S1/S2 +, no murmurs, no rubs, no gallops Respiratory: Clear to auscultation bilaterally, no wheezing, no crackles, no rhonchi Abdominal: Soft, non tender, non distended, bowel sounds +, no guarding Extremities: no edema, no cyanosis, pulses palpable bilaterally DP and PT Neuro: Grossly nonfocal  Discharge Instructions  Discharge Orders   Future Orders Complete By Expires   Call MD for:  difficulty breathing, headache or visual disturbances  As directed    Call MD for:  hives  As directed    Call MD for:  persistant nausea and vomiting  As directed    Call MD for:  severe uncontrolled pain  As directed    Diet - low sodium heart healthy  As directed    Discharge instructions  As directed    Comments:     1. Please continue prednisone taper 50 mg a day and taper down by 5 mg a day down to 0 mg and then stop (i.e. 50 mg tday, 45 mg tomorrow, then 40 mg the following day etc...) 2. Take Pepcid while on prednisone to protect gastric mucosa 3. Take benadryl as needed for itching 4. You are on lisinopril and have reported  cough; this may be a side effect so we stopped lisinopril and started Norvasc 10 mg daily for better BP control   Increase activity slowly  As directed        Medication List    STOP taking these medications       lisinopril-hydrochlorothiazide 10-12.5 MG per tablet  Commonly known as:  PRINZIDE,ZESTORETIC      TAKE these medications       amLODipine 10 MG tablet  Commonly known as:  NORVASC  Take 1 tablet (10 mg total) by mouth daily.     cetirizine 10 MG tablet  Commonly known as:  ZYRTEC  Take 10 mg by mouth daily.     diphenhydrAMINE 25 MG tablet  Commonly known as:  BENADRYL  Take 1 tablet (25 mg total) by mouth every 6 (six) hours as needed for  itching or allergies.     EPINEPHrine 0.3 mg/0.3 mL Devi  Commonly known as:  EPIPEN  Inject 0.3 mLs (0.3 mg total) into the muscle as needed.     EPINEPHrine 0.3 mg/0.3 mL Soaj injection  Commonly known as:  EPIPEN  Inject 0.3 mLs (0.3 mg total) into the muscle as needed.     famotidine 20 MG tablet  Commonly known as:  PEPCID  Take 1 tablet (20 mg total) by mouth 2 (two) times daily.     predniSONE 5 MG tablet  Commonly known as:  DELTASONE  Take 1 tablet (5 mg total) by mouth daily with breakfast.     pseudoephedrine 60 MG tablet  Commonly known as:  SUDAFED  Take 60 mg by mouth every 4 (four) hours as needed for congestion.           Follow-up Information   Follow up with GUEST, Veneda Melter, MD. Schedule an appointment as soon as possible for a visit in 2 weeks.   Specialty:  Internal Medicine   Contact information:   [place] [address] [phone]        The results of significant diagnostics from this hospitalization (including imaging, microbiology, ancillary and laboratory) are listed below for reference.    Significant Diagnostic Studies: No results found.  Microbiology: No results found for this or any previous visit (from the past 240 hour(s)).   Labs: Basic Metabolic Panel:  Recent Labs Lab 12/11/13 0317  NA 134*  K 4.0  CL 99  CO2 22  GLUCOSE 219*  BUN 21  CREATININE 0.70  CALCIUM 9.4   Liver Function Tests: No results found for this basename: AST, ALT, ALKPHOS, BILITOT, PROT, ALBUMIN,  in the last 168 hours No results found for this basename: LIPASE, AMYLASE,  in the last 168 hours No results found for this basename: AMMONIA,  in the last 168 hours CBC:  Recent Labs Lab 12/11/13 0317  WBC 10.8*  HGB 12.1  HCT 36.4  MCV 84.8  PLT 320   Cardiac Enzymes: No results found for this basename: CKTOTAL, CKMB, CKMBINDEX, TROPONINI,  in the last 168 hours BNP: BNP (last 3 results) No results found for this  basename: PROBNP,  in the last 8760 hours CBG:  Recent Labs Lab 12/11/13 0808 12/11/13 1151  GLUCAP 143* 191*    Time coordinating discharge: Over 30 minutes

## 2013-12-11 NOTE — ED Notes (Signed)
Bed: WA17 Expected date:  Expected time:  Means of arrival:  Comments: Hold for room 2

## 2013-12-11 NOTE — ED Provider Notes (Signed)
CSN: [id number]     Arrival date & time 12/11/13  0041 History   First MD Initiated Contact with Patient 12/11/13 0220     Chief Complaint  Patient presents with  . Allergic Reaction   (Consider location/radiation/quality/duration/timing/severity/associated sxs/prior Treatment) HPI History provided by patient. Has a known seafood allergy, ate lobster tonight. Around 8:30 PM developed an allergic reaction with rash and swelling, she used her EpiPen at home and presented to the emergency room. She was treated emergency department and discharged back home with improving condition. Her rash had resolved and she was a longer itching or swelling. She got home her rash returned with facial swelling and tightness in her throat and difficulty breathing. She used another EpiPen and time she got here again her rash and swelling have resolved. At this time she has no complaints, no wheezes, no rash, no itching. Symptoms moderate to severe.   Past Medical History  Diagnosis Date  . Hypertension   . Allergy   . Fibroids   . Dysmenorrhea    Past Surgical History  Procedure Laterality Date  . Laser ablation of the cervix    . Myomectomy     Family History  Problem Relation Age of Onset  . Alzheimer's disease Mother   . Hypertension Mother   . Diabetes Father   . Hypertension Father   . COPD Brother   . Heart attack Sister    History  Substance Use Topics  . Smoking status: Never Smoker   . Smokeless tobacco: Not on file  . Alcohol Use: Yes   OB History   Grav Para Term Preterm Abortions TAB SAB Ect Mult Living                 Review of Systems  Constitutional: Negative for fever and chills.  Respiratory: Negative for chest tightness and shortness of breath.   Cardiovascular: Negative for chest pain.  Gastrointestinal: Negative for abdominal pain.  Genitourinary: Negative for dysuria.  Musculoskeletal: Negative for back pain, neck pain and neck stiffness.  Skin: Positive for rash.   Neurological: Negative for headaches.  All other systems reviewed and are negative.    Allergies  Shellfish allergy  Home Medications   Current Outpatient Rx  Name  Route  Sig  Dispense  Refill  . cetirizine (ZYRTEC) 10 MG tablet   Oral   Take 10 mg by mouth daily.         Marland Kitchen EPINEPHrine (EPIPEN) 0.3 mg/0.3 mL DEVI   Intramuscular   Inject 0.3 mLs (0.3 mg total) into the muscle as needed.   2 Device   0   . EPINEPHrine (EPIPEN) 0.3 mg/0.3 mL SOAJ injection   Intramuscular   Inject 0.3 mLs (0.3 mg total) into the muscle as needed.   2 Device   1   . lisinopril-hydrochlorothiazide (PRINZIDE,ZESTORETIC) 10-12.5 MG per tablet   Oral   Take 1 tablet by mouth daily.   30 tablet   5   . pseudoephedrine (SUDAFED) 60 MG tablet   Oral   Take 60 mg by mouth every 4 (four) hours as needed for congestion.          BP 138/61  Pulse 110  Temp(Src) 98.5 F (36.9 C) (Oral)  Resp 20  SpO2 96%  LMP 11/26/2013 Physical Exam  Constitutional: She is oriented to person, place, and time. She appears well-developed and well-nourished.  HENT:  Head: Normocephalic and atraumatic.  Mouth/Throat: Oropharynx is clear and moist. No oropharyngeal exudate.  Uvula midline  Eyes: EOM are normal. Pupils are equal, round, and reactive to light.  Neck: Neck supple. No thyromegaly present.  Cardiovascular: Regular rhythm and intact distal pulses.   Tachycardic  Pulmonary/Chest: Effort normal. No stridor. No respiratory distress. She has no wheezes. She exhibits no tenderness.  Abdominal: Soft. Bowel sounds are normal. There is no tenderness.  Musculoskeletal: Normal range of motion. She exhibits no edema.  Neurological: She is alert and oriented to person, place, and time.  Skin: Skin is warm and dry. No rash noted.    ED Course  Procedures (including critical care time) Labs Review Labs Reviewed  CBC - Abnormal; Notable for the following:    WBC 10.8 (*)    All other components  within normal limits  BASIC METABOLIC PANEL - Abnormal; Notable for the following:    Sodium 134 (*)    Glucose, Bld 219 (*)    All other components within normal limits   Imaging Review No results found.  Room air pulse ox 96% is adequate  IV established and cardiac monitoring initiated.  Discussed with hospitalist, plan admit observation MDM  Diagnosis: Allergic reaction - biphasic reaction concerning for anaphylaxis  Steroids provided MED admit     Teressa Lower, MD 12/11/13 [phone]

## 2014-04-11 ENCOUNTER — Other Ambulatory Visit: Payer: Self-pay | Admitting: Physician Assistant

## 2014-04-17 ENCOUNTER — Telehealth: Payer: Self-pay

## 2014-04-17 NOTE — Telephone Encounter (Signed)
Pt called. She has an appt with Dr. Lorelei Pont on 05/01/14. She's worried because her BP's are running in the 160/90 range. She's not sure what to do. She was on Lisinopril/HCT for years, but at her last ED visit, they changed her to Amlodipine. She has been out of this for 2 weeks, but wants to go back to Lisinopril. Doesn't want to come in since she does have an appt coming up, but wants to know what she should do about her BP. Please advise. Thanks

## 2014-04-17 NOTE — Telephone Encounter (Signed)
Pt is calling request a refill on blood pressure medication,she does have an appointment on 05/01/14 with DrCopland, but is out now. Wants to know if rx can be refilled until she can get in to see dr

## 2014-04-18 MED ORDER — AMLODIPINE BESYLATE 10 MG PO TABS: 10.0000 mg | ORAL_TABLET | Freq: Every day | ORAL | Status: DC

## 2014-04-18 NOTE — Telephone Encounter (Signed)
Pt.notified

## 2014-04-18 NOTE — Telephone Encounter (Signed)
Continue amlodipine for now.  I have sent this to the pharmacy.  Discuss with Dr. Lorelei Pont at next visit

## 2014-05-01 ENCOUNTER — Encounter: Payer: BC Managed Care – PPO | Admitting: Family Medicine

## 2014-05-15 ENCOUNTER — Encounter: Payer: Self-pay | Admitting: Family Medicine

## 2014-05-15 ENCOUNTER — Ambulatory Visit (INDEPENDENT_AMBULATORY_CARE_PROVIDER_SITE_OTHER): Payer: BC Managed Care – PPO | Admitting: Family Medicine

## 2014-05-15 VITALS — BP 138/78 | HR 77 | Temp 97.8°F | Resp 16 | Ht 61.0 in | Wt 192.0 lb

## 2014-05-15 DIAGNOSIS — Z13220 Encounter for screening for lipoid disorders: Secondary | ICD-10-CM

## 2014-05-15 DIAGNOSIS — I10 Essential (primary) hypertension: Secondary | ICD-10-CM

## 2014-05-15 DIAGNOSIS — N946 Dysmenorrhea, unspecified: Secondary | ICD-10-CM

## 2014-05-15 DIAGNOSIS — Z1329 Encounter for screening for other suspected endocrine disorder: Secondary | ICD-10-CM

## 2014-05-15 DIAGNOSIS — Z124 Encounter for screening for malignant neoplasm of cervix: Secondary | ICD-10-CM

## 2014-05-15 DIAGNOSIS — E669 Obesity, unspecified: Secondary | ICD-10-CM

## 2014-05-15 DIAGNOSIS — Z0000 Encounter for general adult medical examination without abnormal findings: Secondary | ICD-10-CM

## 2014-05-15 DIAGNOSIS — D259 Leiomyoma of uterus, unspecified: Secondary | ICD-10-CM

## 2014-05-15 LAB — CBC
HCT: 36.9 % (ref 36.0–46.0)
Hemoglobin: 12.3 g/dL (ref 12.0–15.0)
MCH: 27.3 pg (ref 26.0–34.0)
MCHC: 33.3 g/dL (ref 30.0–36.0)
MCV: 81.8 fL (ref 78.0–100.0)
Platelets: 372 10*3/uL (ref 150–400)
RBC: 4.51 MIL/uL (ref 3.87–5.11)
RDW: 14 % (ref 11.5–15.5)
WBC: 6.7 10*3/uL (ref 4.0–10.5)

## 2014-05-15 MED ORDER — AMLODIPINE BESYLATE 10 MG PO TABS: 10.0000 mg | ORAL_TABLET | Freq: Every day | ORAL | Status: DC

## 2014-05-15 MED ORDER — MELOXICAM 7.5 MG PO TABS: 7.5000 mg | ORAL_TABLET | Freq: Every day | ORAL | Status: DC

## 2014-05-15 NOTE — Patient Instructions (Signed)
Don't forget to schedule a mammogram soon!  [facility]   Address: [address], [place], [place] [address]  [phone]  I will be in touch with your labs.  Try taking a mobic (one a day) a few days prior to and during your period.  I will refer you for another opinion regarding your fibroids.

## 2014-05-15 NOTE — Progress Notes (Signed)
[facility] [address], [place] [address] [phone]  Date:  05/15/2014   Name:  Mckenzie Hill   DOB:  [birth year]   MRN:  [id number]  PCP:  Kennon Portela, MD    Chief Complaint: Annual Exam   History of Present Illness:  Mckenzie Hill is a 48 y.o. very pleasant female patient who presents with the following:  Here today for a CPE.  She does take medication for HTN but is otherwise healthy.   She had a laser treatment of her cervix in the distant past- maybe in 2006.  Paps ok since then.   She did have one mammogram- it was done several years ago.  She would like to go ahead and schedule this again.  She was on lisinopril/ hctz but this was changed to norvasc a few years ago after she had an allergic reaction to seafood.   She has noted more painful periods, and heavier bleeding over the last few years.  She did have an ultrasound a few years ago which showed fibroids.  She saw OBG a couple of years ago and was put on OCP- however she did not like taking these that much.  She recalls being told there was not much else she could do about this and was frustrated.  She would like another opinion  Fasting today for labs  Patient Active Problem List   Diagnosis Date Noted  . Allergic reaction 12/11/2013  . HTN (hypertension) 12/11/2013    Past Medical History  Diagnosis Date  . Hypertension   . Allergy   . Fibroids   . Dysmenorrhea     Past Surgical History  Procedure Laterality Date  . Laser ablation of the cervix    . Myomectomy      History  Substance Use Topics  . Smoking status: Never Smoker   . Smokeless tobacco: Not on file  . Alcohol Use: Yes    Family History  Problem Relation Age of Onset  . Alzheimer's disease Mother   . Hypertension Mother   . Diabetes Father   . Hypertension Father   . COPD Brother   . Heart attack Sister     Allergies  Allergen Reactions  . Shellfish Allergy Anaphylaxis, Hives and Itching     Medication list has been reviewed and updated.  Current Outpatient Prescriptions on File Prior to Visit  Medication Sig Dispense Refill  . amLODipine (NORVASC) 10 MG tablet Take 1 tablet (10 mg total) by mouth daily.  30 tablet  0  . cetirizine (ZYRTEC) 10 MG tablet Take 10 mg by mouth daily.      Marland Kitchen EPINEPHrine (EPIPEN) 0.3 mg/0.3 mL DEVI Inject 0.3 mLs (0.3 mg total) into the muscle as needed.  2 Device  0  . pseudoephedrine (SUDAFED) 60 MG tablet Take 60 mg by mouth every 4 (four) hours as needed for congestion.      . diphenhydrAMINE (BENADRYL) 25 MG tablet Take 1 tablet (25 mg total) by mouth every 6 (six) hours as needed for itching or allergies.  45 tablet  0  . EPINEPHrine (EPIPEN) 0.3 mg/0.3 mL SOAJ injection Inject 0.3 mLs (0.3 mg total) into the muscle as needed.  2 Device  1  . famotidine (PEPCID) 20 MG tablet Take 1 tablet (20 mg total) by mouth 2 (two) times daily.  28 tablet  0  . predniSONE (DELTASONE) 5 MG tablet Take 1 tablet (5 mg total) by mouth daily with breakfast.  55 tablet  0   No current facility-administered medications on file prior to visit.    Review of Systems:  As per HPI- otherwise negative.   Physical Examination: Filed Vitals:   05/15/14 1145  BP: 138/78  Pulse: 77  Temp: 97.8 F (36.6 C)  Resp: 16   Filed Vitals:   05/15/14 1145  Height: 5\' 1"  (1.549 m)  Weight: 192 lb (87.091 kg)   Body mass index is 36.3 kg/(m^2). Ideal Body Weight: Weight in (lb) to have BMI = 25: 132  GEN: WDWN, NAD, Non-toxic, A & O x 3, overweight, looks well HEENT: Atraumatic, Normocephalic. Neck supple. No masses, No LAD.  Bilateral TM wnl, oropharynx normal.  PEERL,EOMI.   Ears and Nose: No external deformity. CV: RRR, No M/G/R. No JVD. No thrill. No extra heart sounds. PULM: CTA B, no wheezes, crackles, rhonchi. No retractions. No resp. distress. No accessory muscle use. ABD: S, NT, ND. No rebound. No HSM. EXTR: No c/c/e NEURO Normal gait.  PSYCH:  Normally interactive. Conversant. Not depressed or anxious appearing.  Calm demeanor.  Breast: normal exam, no masses/ dimpling/ discharge Pelvic: normal, no vaginal lesions or discharge. Uterus normal, no CMT, no adnexal tendereness or masses  Assessment and Plan: Physical exam - Plan: CBC  Screening for cervical cancer - Plan: Pap IG, CT/NG w/ reflex HPV when ASC-U  Essential hypertension - Plan: Comprehensive metabolic panel, amLODipine (NORVASC) 10 MG tablet, DISCONTINUED: amLODipine (NORVASC) 10 MG tablet  Screening for hyperlipidemia - Plan: Lipid panel  Uterine leiomyoma, unspecified location - Plan: meloxicam (MOBIC) 7.5 MG tablet, Ambulatory referral to Gynecology, CANCELED: Ambulatory referral to Gynecology  Screening for hypothyroidism - Plan: TSH  Dysmenorrhea - Plan: Ambulatory referral to Gynecology  Healthy except for controlled HTN and overweight.  Await labs as above, immunizations are UTD.   She may try some mobic as needed for dysmenorrhea, and will refer to OBG for another opinion.    Signed Lamar Blinks, MD

## 2014-05-16 LAB — COMPREHENSIVE METABOLIC PANEL
ALBUMIN: 4.4 g/dL (ref 3.5–5.2)
ALT: 12 U/L (ref 0–35)
AST: 15 U/L (ref 0–37)
Alkaline Phosphatase: 106 U/L (ref 39–117)
BUN: 15 mg/dL (ref 6–23)
CALCIUM: 9.3 mg/dL (ref 8.4–10.5)
CHLORIDE: 99 meq/L (ref 96–112)
CO2: 27 meq/L (ref 19–32)
Creat: 0.72 mg/dL (ref 0.50–1.10)
GLUCOSE: 87 mg/dL (ref 70–99)
Potassium: 4.3 mEq/L (ref 3.5–5.3)
Sodium: 136 mEq/L (ref 135–145)
Total Bilirubin: 0.8 mg/dL (ref 0.2–1.2)
Total Protein: 7.4 g/dL (ref 6.0–8.3)

## 2014-05-16 LAB — TSH: TSH: 2.643 u[IU]/mL (ref 0.350–4.500)

## 2014-05-16 LAB — PAP IG, CT-NG, RFX HPV ASCU
Chlamydia Probe Amp: NEGATIVE
GC PROBE AMP: NEGATIVE

## 2014-05-16 LAB — LIPID PANEL
Cholesterol: 166 mg/dL (ref 0–200)
HDL: 54 mg/dL (ref >39)
LDL Cholesterol: 84 mg/dL (ref 0–99)
TRIGLYCERIDES: 141 mg/dL (ref <150)
Total CHOL/HDL Ratio: 3.1 Ratio
VLDL: 28 mg/dL (ref 0–40)

## 2014-05-17 ENCOUNTER — Encounter: Payer: Self-pay | Admitting: Family Medicine

## 2014-06-27 ENCOUNTER — Telehealth: Payer: Self-pay

## 2014-06-27 DIAGNOSIS — N946 Dysmenorrhea, unspecified: Secondary | ICD-10-CM

## 2014-06-27 MED ORDER — TRAMADOL HCL 50 MG PO TABS: 50.0000 mg | ORAL_TABLET | Freq: Two times a day (BID) | ORAL | Status: DC | PRN

## 2014-06-27 NOTE — Telephone Encounter (Signed)
I had seen her in June. Given her mobic and referred to see OBG.  She had an appt to see Dr. Julien Girt in July.  However she had to reschedule this and will not be seen until 8/25.  She has her menses right now and states she had terrible pain last night.  This was typical for her dysmenorrhea pain per her report.  Right now she feels better but she is worried about what will happen tonight.   Will call in tramadol for her to use as needed- cautioned regarding sedation. She will seek help if her pain worsens or becomes unusual in any way

## 2014-06-27 NOTE — Telephone Encounter (Signed)
PT STATES SHE WAS GIVEN PAIN MEDICINE FOR HER MENSTRUAL CRAMPS AND IT ISN'T HELPING, THE DR TOLD HER TO CALL BACK IF THAT BE THE CASE AND IT IS PLEASE CALL [phone]

## 2015-03-07 ENCOUNTER — Ambulatory Visit (INDEPENDENT_AMBULATORY_CARE_PROVIDER_SITE_OTHER): Payer: BLUE CROSS/BLUE SHIELD | Admitting: Emergency Medicine

## 2015-03-07 VITALS — BP 138/82 | HR 110 | Temp 98.6°F | Resp 20 | Ht 61.0 in | Wt 194.0 lb

## 2015-03-07 DIAGNOSIS — E669 Obesity, unspecified: Secondary | ICD-10-CM | POA: Diagnosis not present

## 2015-03-07 DIAGNOSIS — J302 Other seasonal allergic rhinitis: Secondary | ICD-10-CM

## 2015-03-07 MED ORDER — TRIAMCINOLONE ACETONIDE 55 MCG/ACT NA AERO: 2.0000 | INHALATION_SPRAY | Freq: Every day | NASAL | Status: DC

## 2015-03-07 MED ORDER — EPINEPHRINE 0.3 MG/0.3ML IJ SOAJ: 0.3000 mg | INTRAMUSCULAR | Status: DC | PRN

## 2015-03-07 NOTE — Patient Instructions (Signed)

## 2015-03-07 NOTE — Progress Notes (Signed)
[facility] [address] [place] [address] [phone]  Date:  03/07/2015   Name:  Mckenzie Hill   DOB:  Jul 19, 1966   MRN:  [id number]  PCP:  Kennon Portela, MD    Chief Complaint: Seasonal Allergies   History of Present Illness:  Mckenzie Hill is a 49 y.o. very pleasant female patient who presents with the following:  History of season allergic rhinitis and is having worse symptoms due pollen. Has watery nasal discharge and itchy eyes Nasal congestion No cough, wheezing or shortness of breath No nausea or vomiting No rash No fever or chills No improvement with over the counter medications or other home remedies.  Denies other complaint or health concern today.   Patient Active Problem List   Diagnosis Date Noted  . Obesity, unspecified 05/15/2014  . Allergic reaction 12/11/2013  . HTN (hypertension) 12/11/2013    Past Medical History  Diagnosis Date  . Hypertension   . Allergy   . Fibroids   . Dysmenorrhea     Past Surgical History  Procedure Laterality Date  . Laser ablation of the cervix    . Myomectomy      History  Substance Use Topics  . Smoking status: Never Smoker   . Smokeless tobacco: Not on file  . Alcohol Use: Yes    Family History  Problem Relation Age of Onset  . Alzheimer's disease Mother   . Hypertension Mother   . Diabetes Father   . Hypertension Father   . COPD Brother   . Heart attack Sister     Allergies  Allergen Reactions  . Shellfish Allergy Anaphylaxis, Hives and Itching    Medication list has been reviewed and updated.  Current Outpatient Prescriptions on File Prior to Visit  Medication Sig Dispense Refill  . amLODipine (NORVASC) 10 MG tablet Take 1 tablet (10 mg total) by mouth daily. 90 tablet 3  . cetirizine (ZYRTEC) 10 MG tablet Take 10 mg by mouth daily.    . diphenhydrAMINE (BENADRYL) 25 MG tablet Take 1 tablet (25 mg total) by mouth every 6 (six) hours as needed for itching or  allergies. 45 tablet 0  . EPINEPHrine (EPIPEN) 0.3 mg/0.3 mL DEVI Inject 0.3 mLs (0.3 mg total) into the muscle as needed. 2 Device 0  . EPINEPHrine (EPIPEN) 0.3 mg/0.3 mL SOAJ injection Inject 0.3 mLs (0.3 mg total) into the muscle as needed. 2 Device 1  . pseudoephedrine (SUDAFED) 60 MG tablet Take 60 mg by mouth every 4 (four) hours as needed for congestion.     No current facility-administered medications on file prior to visit.    Review of Systems:  As per HPI, otherwise negative.    Physical Examination: Filed Vitals:   03/07/15 1513  BP: 138/82  Pulse: 110  Temp: 98.6 F (37 C)  Resp: 20   Filed Vitals:   03/07/15 1513  Height: 5\' 1"  (1.549 m)  Weight: 194 lb (87.998 kg)   Body mass index is 36.67 kg/(m^2). Ideal Body Weight: Weight in (lb) to have BMI = 25: 132   GEN: WDWN, NAD, Non-toxic, Alert & Oriented x 3 HEENT: Atraumatic, Normocephalic.  Ears and Nose: No external deformity. EXTR: No clubbing/cyanosis/edema NEURO: Normal gait.  PSYCH: Normally interactive. Conversant. Not depressed or anxious appearing.  Calm demeanor.  CHEST clear BS =  Assessment and Plan: Seasonal allergic rhinitis nasacort   Signed,  Ellison Carwin, MD

## 2015-05-28 ENCOUNTER — Ambulatory Visit (INDEPENDENT_AMBULATORY_CARE_PROVIDER_SITE_OTHER): Payer: BLUE CROSS/BLUE SHIELD | Admitting: Emergency Medicine

## 2015-05-28 VITALS — BP 136/78 | HR 113 | Temp 98.5°F | Resp 17 | Ht 60.5 in | Wt 191.0 lb

## 2015-05-28 DIAGNOSIS — R05 Cough: Secondary | ICD-10-CM

## 2015-05-28 DIAGNOSIS — J029 Acute pharyngitis, unspecified: Secondary | ICD-10-CM

## 2015-05-28 DIAGNOSIS — J040 Acute laryngitis: Secondary | ICD-10-CM

## 2015-05-28 DIAGNOSIS — R059 Cough, unspecified: Secondary | ICD-10-CM

## 2015-05-28 LAB — POCT CBC
Granulocyte percent: 70.4 %G (ref 37–80)
HEMATOCRIT: 36.2 % — AB (ref 37.7–47.9)
Hemoglobin: 11.7 g/dL — AB (ref 12.2–16.2)
Lymph, poc: 1.7 (ref 0.6–3.4)
MCH, POC: 26.3 pg — AB (ref 27–31.2)
MCHC: 32.2 g/dL (ref 31.8–35.4)
MCV: 81.7 fL (ref 80–97)
MID (cbc): 0.4 (ref 0–0.9)
MPV: 6.9 fL (ref 0–99.8)
POC GRANULOCYTE: 4.9 (ref 2–6.9)
POC LYMPH %: 23.6 % (ref 10–50)
POC MID %: 6 %M (ref 0–12)
Platelet Count, POC: 345 10*3/uL (ref 142–424)
RBC: 4.43 M/uL (ref 4.04–5.48)
RDW, POC: 15 %
WBC: 7 10*3/uL (ref 4.6–10.2)

## 2015-05-28 LAB — GLUCOSE, POCT (MANUAL RESULT ENTRY): POC GLUCOSE: 85 mg/dL (ref 70–99)

## 2015-05-28 LAB — POCT RAPID STREP A (OFFICE): Rapid Strep A Screen: NEGATIVE

## 2015-05-28 MED ORDER — BENZONATATE 100 MG PO CAPS: 100.0000 mg | ORAL_CAPSULE | Freq: Three times a day (TID) | ORAL | Status: DC | PRN

## 2015-05-28 MED ORDER — FIRST-DUKES MOUTHWASH MT SUSP: OROMUCOSAL | Status: DC

## 2015-05-28 MED ORDER — PREDNISONE 10 MG PO TABS: ORAL_TABLET | ORAL | Status: DC

## 2015-05-28 NOTE — Progress Notes (Addendum)
   Subjective:  This chart was scribed for Nena Jordan, MD by [facility], medical scribe at [facility].The patient was seen in exam room 10 and the patient's care was started at 8:11 AM.   Patient ID: Mckenzie Hill, female    DOB: [birth year], 48 y.o.   MRN: [id number] Chief Complaint  Patient presents with  . Allergies  . Laryngitis  . Emesis  . Cough   HPI HPI Comments: Mckenzie Hill is a 49 y.o. female who [facility] to [facility] complaining of an allergy flare up, onset four days ago. Pt was at [employer] for vacation last week. She says a change in the environment will cause a flare up. She has a gradually improving sore throat, cough, hoarseness and emesis due to coughing. Pt has been taking sudafed and zyrtec for relief. She has taken prednisone in the past. Pt is a English as a second language teacher. She has an epi pen for a shell fish allergy. She denies wheezing, fever, and chills.  Review of Systems  Constitutional: Negative for fever and chills.  HENT: Positive for sore throat and voice change.   Respiratory: Positive for cough. Negative for wheezing.   Gastrointestinal: Positive for vomiting.  Allergic/Immunologic: Positive for environmental allergies.     Objective:  BP 136/78 mmHg  Pulse 113  Temp(Src) 98.5 F (36.9 C) (Oral)  Resp 17  Ht 5' 0.5" (1.537 m)  Wt 191 lb (86.637 kg)  BMI 36.67 kg/m2  SpO2 98%  LMP 05/25/2015 Physical Exam  Vitals reviewed. CONSTITUTIONAL: Well developed/well nourished, alert and cooperative but has a hoarseness to her voice. HEAD: Normocephalic/atraumatic EYES: EOMI/PERRL, conjunctiva were not injected. ENMT: Mucous membranes moist, mild nasal congestion, redness posterior oropharynx but had a red cough drop. NECK: supple no meningeal signs, no lymphadenopathy. SPINE/BACK:entire spine nontender CV: S1/S2 noted, no murmurs/rubs/gallops noted LUNGS: Lungs are clear to auscultation bilaterally, no apparent distress ABDOMEN:  soft, nontender, no rebound or guarding, bowel sounds noted throughout abdomen GU:no cva tenderness NEURO: Pt is awake/alert/appropriate, moves all extremitiesx4.  No facial droop.   EXTREMITIES: pulses normal/equal, full ROM SKIN: warm, color normal PSYCH: no abnormalities of mood noted, alert and oriented to situation. Results for orders placed or performed in visit on 05/28/15  POCT rapid strep A  Result Value Ref Range   Rapid Strep A Screen Negative Negative      Assessment & Plan:  1. Sore throat We'll treat with [facility]'s mouthwash. - POCT rapid strep A - Culture, Group A Strep - Culture, Group A Strep  2. Cough She was given Tessalon pearls and placed on a taper dose of prednisone. There initially was some confusion regarding the way the prescription was written I talk with the pharmacist personally and make sure she could go ahead and pick up the prednisone.  3. Laryngitis She was given a taper dose of prednisone - Culture, Group A Strep  4. Tachycardia Patient had taken Sudafed and I suspect her tachycardia is related to this I personally performed the services described in this documentation, which was scribed in my presence. The recorded information has been reviewed and is accurate.  Arlyss Queen, MD  [facility]  05/28/2015 8:49 AM

## 2015-05-28 NOTE — Patient Instructions (Signed)
Laryngitis At the top of your windpipe is your voice box. It is the source of your voice. Inside your voice box are 2 bands of muscles called vocal cords. When you breathe, your vocal cords are relaxed and open so that air can get into the lungs. When you decide to say something, these cords come together and vibrate. The sound from these vibrations goes into your throat and comes out through your mouth as sound. Laryngitis is an inflammation of the vocal cords that causes hoarseness, cough, loss of voice, sore throat, and dry throat. Laryngitis can be temporary (acute) or long-term (chronic). Most cases of acute laryngitis improve with time.Chronic laryngitis lasts for more than 3 weeks. CAUSES Laryngitis can often be related to excessive smoking, talking, or yelling, as well as inhalation of toxic fumes and allergies. Acute laryngitis is usually caused by a viral infection, vocal strain, measles or mumps, or bacterial infections. Chronic laryngitis is usually caused by vocal cord strain, vocal cord injury, postnasal drip, growths on the vocal cords, or acid reflux. SYMPTOMS   Cough.  Sore throat.  Dry throat. RISK FACTORS  Respiratory infections.  Exposure to irritating substances, such as cigarette smoke, excessive amounts of alcohol, stomach acids, and workplace chemicals.  Voice trauma, such as vocal cord injury from shouting or speaking too loud. DIAGNOSIS  Your cargiver will perform a physical exam. During the physical exam, your caregiver will examine your throat. The most common sign of laryngitis is hoarseness. Laryngoscopy may be necessary to confirm the diagnosis of this condition. This procedure allows your caregiver to look into the larynx. HOME CARE INSTRUCTIONS  Drink enough fluids to keep your urine clear or pale yellow.  Rest until you no longer have symptoms or as directed by your caregiver.  Breathe in moist air.  Take all medicine as directed by your  caregiver.  Do not smoke.  Talk as little as possible (this includes whispering).  Write on paper instead of talking until your voice is back to normal.  Follow up with your caregiver if your condition has not improved after 10 days. SEEK MEDICAL CARE IF:   You have trouble breathing.  You cough up blood.  You have persistent fever.  You have increasing pain.  You have difficulty swallowing. MAKE SURE YOU:  Understand these instructions.  Will watch your condition.  Will get help right away if you are not doing well or get worse. Document Released: 11/03/2005 Document Revised: 01/26/2012 Document Reviewed: 01/09/2011 [facility] Patient Information 2015 [place], [place]. This information is not intended to replace advice given to you by your health care provider. Make sure you discuss any questions you have with your health care provider. Sore Throat A sore throat is pain, burning, irritation, or scratchiness of the throat. There is often pain or tenderness when swallowing or talking. A sore throat may be accompanied by other symptoms, such as coughing, sneezing, fever, and swollen neck glands. A sore throat is often the first sign of another sickness, such as a cold, flu, strep throat, or mononucleosis (commonly known as mono). Most sore throats go away without medical treatment. CAUSES  The most common causes of a sore throat include:  A viral infection, such as a cold, flu, or mono.  A bacterial infection, such as strep throat, tonsillitis, or whooping cough.  Seasonal allergies.  Dryness in the air.  Irritants, such as smoke or pollution.  Gastroesophageal reflux disease (GERD). HOME CARE INSTRUCTIONS   Only take over-the-counter medicines as  directed by your caregiver.  Drink enough fluids to keep your urine clear or pale yellow.  Rest as needed.  Try using throat sprays, lozenges, or sucking on hard candy to ease any pain (if older than 4 years or as  directed).  Sip warm liquids, such as broth, herbal tea, or warm water with honey to relieve pain temporarily. You may also eat or drink cold or frozen liquids such as frozen ice pops.  Gargle with salt water (mix 1 tsp salt with 8 oz of water).  Do not smoke and avoid secondhand smoke.  Put a cool-mist humidifier in your bedroom at night to moisten the air. You can also turn on a hot shower and sit in the bathroom with the door closed for 5-10 minutes. SEEK IMMEDIATE MEDICAL CARE IF:  You have difficulty breathing.  You are unable to swallow fluids, soft foods, or your saliva.  You have increased swelling in the throat.  Your sore throat does not get better in 7 days.  You have nausea and vomiting.  You have a fever or persistent symptoms for more than 2-3 days.  You have a fever and your symptoms suddenly get worse. MAKE SURE YOU:   Understand these instructions.  Will watch your condition.  Will get help right away if you are not doing well or get worse. Document Released: 12/11/2004 Document Revised: 10/20/2012 Document Reviewed: 07/11/2012 [facility] Patient Information 2015 [place], [place]. This information is not intended to replace advice given to you by your health care provider. Make sure you discuss any questions you have with your health care provider.

## 2015-05-29 ENCOUNTER — Other Ambulatory Visit: Payer: Self-pay

## 2015-05-29 LAB — CULTURE, GROUP A STREP: ORGANISM ID, BACTERIA: NORMAL

## 2015-05-29 MED ORDER — PREDNISONE 10 MG PO TABS: ORAL_TABLET | ORAL | Status: DC

## 2015-05-29 NOTE — Telephone Encounter (Signed)
Pharm faxed req for clarification of sig. I resent it.

## 2015-05-30 ENCOUNTER — Telehealth: Payer: Self-pay

## 2015-05-30 DIAGNOSIS — J029 Acute pharyngitis, unspecified: Secondary | ICD-10-CM

## 2015-05-30 MED ORDER — FIRST-DUKES MOUTHWASH MT SUSP: OROMUCOSAL | Status: DC

## 2015-05-30 NOTE — Telephone Encounter (Signed)
Patient states the pharmacy has the Diphenhyd-Hydrocort-Nystatin (FIRST-DUKES MOUTHWASH) SUSP Is on back order.   Please send something in as a replacement   [phone] (H)

## 2015-05-30 NOTE — Telephone Encounter (Signed)
Pt left a voicemail wanting the Rx to go to [facility]. Rx sent.

## 2015-05-30 NOTE — Telephone Encounter (Signed)
We can send into another pharmacy. Left message for pt to call back.

## 2015-09-09 ENCOUNTER — Emergency Department (HOSPITAL_COMMUNITY)
Admission: EM | Admit: 2015-09-09 | Discharge: 2015-09-09 | Disposition: A | Discharge status: Home / Self Care | Payer: BLUE CROSS/BLUE SHIELD | Attending: Emergency Medicine | Admitting: Emergency Medicine

## 2015-09-09 ENCOUNTER — Encounter (HOSPITAL_COMMUNITY): Payer: Self-pay | Admitting: Emergency Medicine

## 2015-09-09 DIAGNOSIS — T7840XA Allergy, unspecified, initial encounter: Secondary | ICD-10-CM

## 2015-09-09 DIAGNOSIS — Z7951 Long term (current) use of inhaled steroids: Secondary | ICD-10-CM | POA: Insufficient documentation

## 2015-09-09 DIAGNOSIS — X58XXXA Exposure to other specified factors, initial encounter: Secondary | ICD-10-CM | POA: Insufficient documentation

## 2015-09-09 DIAGNOSIS — Z79899 Other long term (current) drug therapy: Secondary | ICD-10-CM | POA: Insufficient documentation

## 2015-09-09 DIAGNOSIS — Y998 Other external cause status: Secondary | ICD-10-CM | POA: Diagnosis not present

## 2015-09-09 DIAGNOSIS — Y9289 Other specified places as the place of occurrence of the external cause: Secondary | ICD-10-CM | POA: Insufficient documentation

## 2015-09-09 DIAGNOSIS — I10 Essential (primary) hypertension: Secondary | ICD-10-CM | POA: Insufficient documentation

## 2015-09-09 DIAGNOSIS — Z8742 Personal history of other diseases of the female genital tract: Secondary | ICD-10-CM | POA: Diagnosis not present

## 2015-09-09 DIAGNOSIS — T781XXA Other adverse food reactions, not elsewhere classified, initial encounter: Secondary | ICD-10-CM | POA: Insufficient documentation

## 2015-09-09 DIAGNOSIS — Z86018 Personal history of other benign neoplasm: Secondary | ICD-10-CM | POA: Diagnosis not present

## 2015-09-09 DIAGNOSIS — Y9389 Activity, other specified: Secondary | ICD-10-CM | POA: Insufficient documentation

## 2015-09-09 DIAGNOSIS — R21 Rash and other nonspecific skin eruption: Secondary | ICD-10-CM | POA: Insufficient documentation

## 2015-09-09 MED ORDER — ONDANSETRON HCL 4 MG/2ML IJ SOLN
4.0000 mg | Freq: Once | INTRAMUSCULAR | Status: AC
  Administered 2015-09-09: 4 mg via INTRAVENOUS
  Filled 2015-09-09: qty 2

## 2015-09-09 MED ORDER — SODIUM CHLORIDE 0.9 % IV BOLUS (SEPSIS)
1000.0000 mL | Freq: Once | INTRAVENOUS | Status: AC
  Administered 2015-09-09: 1000 mL via INTRAVENOUS

## 2015-09-09 MED ORDER — FAMOTIDINE IN NACL 20-0.9 MG/50ML-% IV SOLN
20.0000 mg | Freq: Once | INTRAVENOUS | Status: AC
  Administered 2015-09-09: 20 mg via INTRAVENOUS
  Filled 2015-09-09: qty 50

## 2015-09-09 MED ORDER — DIPHENHYDRAMINE HCL 50 MG/ML IJ SOLN
25.0000 mg | Freq: Once | INTRAMUSCULAR | Status: AC
  Administered 2015-09-09: 25 mg via INTRAVENOUS
  Filled 2015-09-09: qty 1

## 2015-09-09 MED ORDER — METHYLPREDNISOLONE SODIUM SUCC 125 MG IJ SOLR
125.0000 mg | Freq: Once | INTRAMUSCULAR | Status: AC
  Administered 2015-09-09: 125 mg via INTRAVENOUS
  Filled 2015-09-09: qty 2

## 2015-09-09 MED ORDER — PREDNISONE 50 MG PO TABS: ORAL_TABLET | ORAL | Status: DC

## 2015-09-09 NOTE — ED Provider Notes (Signed)
CSN: [id number]     Arrival date & time 09/09/15  1953 History   First MD Initiated Contact with Patient 09/09/15 2006     Chief Complaint  Patient presents with  . Allergic Reaction     (Consider location/radiation/quality/duration/timing/severity/associated sxs/prior Treatment) HPI.... Skin rash, itching, throat tightness after eating shellfish tonight. This has happened one other time. She took Benadryl prior to admission which seemed to help some.  No frank respiratory distress. Severity is symptoms is moderate.  Past Medical History  Diagnosis Date  . Hypertension   . Allergy   . Fibroids   . Dysmenorrhea    Past Surgical History  Procedure Laterality Date  . Laser ablation of the cervix    . Myomectomy     Family History  Problem Relation Age of Onset  . Alzheimer's disease Mother   . Hypertension Mother   . Diabetes Father   . Hypertension Father   . COPD Brother   . Heart attack Sister    Social History  Substance Use Topics  . Smoking status: Never Smoker   . Smokeless tobacco: None  . Alcohol Use: Yes     Comment: occ   OB History    No data available     Review of Systems  All other systems reviewed and are negative.     Allergies  Shellfish allergy and Chicken allergy  Home Medications   Prior to Admission medications   Medication Sig Start Date End Date Taking? Authorizing Provider  amLODipine (NORVASC) 10 MG tablet Take 1 tablet (10 mg total) by mouth daily. 05/15/14  Yes Gay Filler Copland, MD  cetirizine (ZYRTEC) 10 MG tablet Take 10 mg by mouth daily as needed for allergies.    Yes Historical Provider, MD  EPINEPHrine 0.3 mg/0.3 mL IJ SOAJ injection Inject 0.3 mLs (0.3 mg total) into the muscle as needed. 03/07/15  Yes Roselee Culver, MD  Fe Cbn-Fe Gluc-FA-B12-C-DSS (FERRALET 90) 90-1 MG TABS Take 90 mg by mouth daily. 09/07/15  Yes Historical Provider, MD  pseudoephedrine (SUDAFED) 60 MG tablet Take 60 mg by mouth every 4 (four) hours  as needed for congestion.   Yes Historical Provider, MD  triamcinolone (NASACORT AQ) 55 MCG/ACT AERO nasal inhaler Place 2 sprays into the nose daily. 03/07/15  Yes Roselee Culver, MD  benzonatate (TESSALON) 100 MG capsule Take 1-2 capsules (100-200 mg total) by mouth 3 (three) times daily as needed for cough. Patient not taking: Reported on 09/09/2015 05/28/15   Darlyne Russian, MD  Diphenhyd-Hydrocort-Nystatin (FIRST-DUKES MOUTHWASH) SUSP 1 teaspoon as rinse gargle and spit 4 times a day Patient not taking: Reported on 09/09/2015 05/30/15   Darlyne Russian, MD  diphenhydrAMINE (BENADRYL) 25 MG tablet Take 1 tablet (25 mg total) by mouth every 6 (six) hours as needed for itching or allergies. Patient not taking: Reported on 05/28/2015 12/11/13   Robbie Lis, MD  EPINEPHrine (EPIPEN) 0.3 mg/0.3 mL DEVI Inject 0.3 mLs (0.3 mg total) into the muscle as needed. Patient not taking: Reported on 05/28/2015 01/05/12   Junius Creamer, NP  predniSONE (DELTASONE) 10 MG tablet Take 4 tabs daily for 3 days, then 3 a day for 3 days, 2 a day for 3 days, one a day for 3 days. Patient not taking: Reported on 09/09/2015 05/29/15   Darlyne Russian, MD  predniSONE (DELTASONE) 50 MG tablet 1 tablet for 4 days, one half tablet for 4 days 09/09/15   Nat Christen, MD  BP 129/74 mmHg  Pulse 95  Temp(Src) 98.1 F (36.7 C) (Oral)  Resp 15  SpO2 95%  LMP 09/01/2015 (Exact Date) Physical Exam  Constitutional: She is oriented to person, place, and time. She appears well-developed and well-nourished.  HENT:  Head: Normocephalic and atraumatic.  Eyes: Conjunctivae and EOM are normal. Pupils are equal, round, and reactive to light.  Neck: Normal range of motion. Neck supple.  Cardiovascular: Normal rate and regular rhythm.   Pulmonary/Chest: Effort normal and breath sounds normal.  Abdominal: Soft. Bowel sounds are normal.  Musculoskeletal: Normal range of motion.  Neurological: She is alert and oriented to person, place, and  time.  Skin:  Chest and bilateral upper extremity erythema and wheals  Psychiatric: She has a normal mood and affect. Her behavior is normal.  Nursing note and vitals reviewed.   ED Course  Procedures (including critical care time) Labs Review Labs Reviewed - No data to display  Imaging Review No results found. I have personally reviewed and evaluated these images and lab results as part of my medical decision-making.   EKG Interpretation None      MDM   Final diagnoses:  Allergic reaction, initial encounter    Patient feels much better after IV steroids, Benadryl, Pepcid. She was observed for 2 hours. Discharge medication prednisone and Benadryl    Nat Christen, MD 09/09/15 2249

## 2015-09-09 NOTE — ED Notes (Addendum)
Patient c/o itching and hives. Patient reports hx of shellfish allergy and eating shellfish tonight. Patient with prior allergic reaction, was treated and went home and symptoms returned. Patient denies SOB or difficulty breathing. States her tongue is tingling.  Patient states she took 2 benadryl PTA.

## 2015-09-09 NOTE — Discharge Instructions (Signed)
Prescription for prednisone to start tomorrow. Also take Benadryl. Return if worse.

## 2015-11-22 ENCOUNTER — Other Ambulatory Visit: Payer: Self-pay | Admitting: Family Medicine

## 2016-01-29 ENCOUNTER — Other Ambulatory Visit: Payer: Self-pay | Admitting: Physician Assistant

## 2016-03-14 ENCOUNTER — Other Ambulatory Visit: Payer: Self-pay | Admitting: Physician Assistant

## 2016-03-24 ENCOUNTER — Ambulatory Visit (INDEPENDENT_AMBULATORY_CARE_PROVIDER_SITE_OTHER): Payer: 59 | Admitting: Emergency Medicine

## 2016-03-24 VITALS — BP 128/80 | HR 108 | Temp 98.7°F | Resp 18 | Ht 60.0 in | Wt 190.0 lb

## 2016-03-24 DIAGNOSIS — J302 Other seasonal allergic rhinitis: Secondary | ICD-10-CM | POA: Diagnosis not present

## 2016-03-24 DIAGNOSIS — J029 Acute pharyngitis, unspecified: Secondary | ICD-10-CM | POA: Diagnosis not present

## 2016-03-24 LAB — POCT RAPID STREP A (OFFICE): Rapid Strep A Screen: NEGATIVE

## 2016-03-24 MED ORDER — PREDNISONE 10 MG PO TABS: ORAL_TABLET | ORAL | Status: DC

## 2016-03-24 MED ORDER — MONTELUKAST SODIUM 10 MG PO TABS: 10.0000 mg | ORAL_TABLET | Freq: Every day | ORAL | Status: DC

## 2016-03-24 NOTE — Patient Instructions (Addendum)
   IF you received an x-ray today, you will receive an invoice from [facility]. Please contact [facility] at [phone] with questions or concerns regarding your invoice.   IF you received labwork today, you will receive an invoice from [facility]. Please contact [facility] at [phone] with questions or concerns regarding your invoice.   Our billing staff will not be able to assist you with questions regarding bills from these companies.  You will be contacted with the lab results as soon as they are available. The fastest way to get your results is to activate your My Chart account. Instructions are located on the last page of this paperwork. If you have not heard from us regarding the results in 2 weeks, please contact this office.    Allergic Rhinitis Allergic rhinitis is when the mucous membranes in the nose respond to allergens. Allergens are particles in the air that cause your body to have an allergic reaction. This causes you to release allergic antibodies. Through a chain of events, these eventually cause you to release histamine into the blood stream. Although meant to protect the body, it is this release of histamine that causes your discomfort, such as frequent sneezing, congestion, and an itchy, runny nose.  CAUSES Seasonal allergic rhinitis (hay fever) is caused by pollen allergens that may come from grasses, trees, and weeds. Year-round allergic rhinitis (perennial allergic rhinitis) is caused by allergens such as house dust mites, pet dander, and mold spores. SYMPTOMS  Nasal stuffiness (congestion).  Itchy, runny nose with sneezing and tearing of the eyes. DIAGNOSIS Your health care provider can help you determine the allergen or allergens that trigger your symptoms. If you and your health care provider are unable to determine the allergen, skin or blood testing may be used. Your health care provider will diagnose your  condition after taking your health history and performing a physical exam. Your health care provider may assess you for other related conditions, such as asthma, pink eye, or an ear infection. TREATMENT Allergic rhinitis does not have a cure, but it can be controlled by:  Medicines that block allergy symptoms. These may include allergy shots, nasal sprays, and oral antihistamines.  Avoiding the allergen. Hay fever may often be treated with antihistamines in pill or nasal spray forms. Antihistamines block the effects of histamine. There are over-the-counter medicines that may help with nasal congestion and swelling around the eyes. Check with your health care provider before taking or giving this medicine. If avoiding the allergen or the medicine prescribed do not work, there are many new medicines your health care provider can prescribe. Stronger medicine may be used if initial measures are ineffective. Desensitizing injections can be used if medicine and avoidance does not work. Desensitization is when a patient is given ongoing shots until the body becomes less sensitive to the allergen. Make sure you follow up with your health care provider if problems continue. HOME CARE INSTRUCTIONS It is not possible to completely avoid allergens, but you can reduce your symptoms by taking steps to limit your exposure to them. It helps to know exactly what you are allergic to so that you can avoid your specific triggers. SEEK MEDICAL CARE IF:  You have a fever.  You develop a cough that does not stop easily (persistent).  You have shortness of breath.  You start wheezing.  Symptoms interfere with normal daily activities.   This information is not intended to replace advice given to you by your   health care provider. Make sure you discuss any questions you have with your health care provider.   Document Released: 07/29/2001 Document Revised: 11/24/2014 Document Reviewed: 07/11/2013 Elsevier Interactive  Patient Education 2016 [employer] Inc.  

## 2016-03-24 NOTE — Progress Notes (Signed)
Subjective:  This chart was scribed for Arlyss Queen MD, by Tamsen Roers, at [facility].  This patient was seen in room 10 and the patient's care was started at 11:57 AM.   Chief Complaint  Patient presents with  . Allergies    Patient ID: Mckenzie Hill, female    DOB: 1966-02-16, 50 y.o.   MRN: [id number]  HPI HPI Comments: Mckenzie Hill is a 50 y.o. female who presents to the [facility] complaining of coughing, headache and stuffiness onset four days ago.  Her symptoms first started with a cough which then proceeded to a sore throat.  She denies any wheezing.  Patient has these symptoms every year and has used Prednisone in the past.   She has been using Nasacort and zyrtec for relief. She has no other concerns today. She has never taken Singulair in the past and is willing to do so.     Patient Active Problem List   Diagnosis Date Noted  . Seasonal allergic rhinitis 03/07/2015  . Obesity 03/07/2015  . Obesity, unspecified 05/15/2014  . Allergic reaction 12/11/2013  . HTN (hypertension) 12/11/2013   Past Medical History  Diagnosis Date  . Hypertension   . Allergy   . Fibroids   . Dysmenorrhea    Past Surgical History  Procedure Laterality Date  . Laser ablation of the cervix    . Myomectomy     Allergies  Allergen Reactions  . Shellfish Allergy Anaphylaxis, Hives and Itching  . Chicken Allergy Rash   Prior to Admission medications   Medication Sig Start Date End Date Taking? Authorizing Provider  amLODipine (NORVASC) 10 MG tablet TAKE ONE TABLET BY MOUTH ONCE DAILY 03/15/16  Yes Mancel Bale, PA-C  cetirizine (ZYRTEC) 10 MG tablet Take 10 mg by mouth daily as needed for allergies.    Yes Historical Provider, MD  diphenhydrAMINE (BENADRYL) 25 MG tablet Take 1 tablet (25 mg total) by mouth every 6 (six) hours as needed for itching or allergies. 12/11/13  Yes Robbie Lis, MD  EPINEPHrine 0.3 mg/0.3 mL IJ SOAJ injection Inject 0.3  mLs (0.3 mg total) into the muscle as needed. 03/07/15  Yes Roselee Culver, MD  Fe Cbn-Fe Gluc-FA-B12-C-DSS (FERRALET 90) 90-1 MG TABS Take 90 mg by mouth daily. 09/07/15  Yes Historical Provider, MD  pseudoephedrine (SUDAFED) 60 MG tablet Take 60 mg by mouth every 4 (four) hours as needed for congestion.   Yes Historical Provider, MD  triamcinolone (NASACORT AQ) 55 MCG/ACT AERO nasal inhaler Place 2 sprays into the nose daily. 03/07/15  Yes Roselee Culver, MD  benzonatate (TESSALON) 100 MG capsule Take 1-2 capsules (100-200 mg total) by mouth 3 (three) times daily as needed for cough. Patient not taking: Reported on 09/09/2015 05/28/15   Darlyne Russian, MD  Diphenhyd-Hydrocort-Nystatin (FIRST-DUKES MOUTHWASH) SUSP 1 teaspoon as rinse gargle and spit 4 times a day Patient not taking: Reported on 09/09/2015 05/30/15   Darlyne Russian, MD  EPINEPHrine (EPIPEN) 0.3 mg/0.3 mL DEVI Inject 0.3 mLs (0.3 mg total) into the muscle as needed. Patient not taking: Reported on 05/28/2015 01/05/12   Junius Creamer, NP  predniSONE (DELTASONE) 50 MG tablet 1 tablet for 4 days, one half tablet for 4 days Patient not taking: Reported on 03/24/2016 09/09/15   Nat Christen, MD   Social History   Social History  . Marital Status: Married    Spouse Name: N/A  . Number of Children:  N/A  . Years of Education: N/A   Occupational History  . Chemist    Social History Main Topics  . Smoking status: Never Smoker   . Smokeless tobacco: Not on file  . Alcohol Use: Yes     Comment: occ  . Drug Use: No  . Sexual Activity: Yes    Birth Control/ Protection: None   Other Topics Concern  . Not on file   Social History Narrative    Review of Systems  Constitutional: Negative for fever and chills.  HENT: Positive for sore throat.   Eyes: Negative for pain, redness and itching.  Respiratory: Positive for cough. Negative for choking and shortness of breath.   Gastrointestinal: Negative for nausea and vomiting.    Musculoskeletal: Negative for neck pain and neck stiffness.  Skin: Negative for color change and rash.  Neurological: Positive for headaches. Negative for seizures, syncope and speech difficulty.       Objective:   Physical Exam  Filed Vitals:   03/24/16 1009  BP: 128/80  Pulse: 108  Temp: 98.7 F (37.1 C)  TempSrc: Oral  Resp: 18  Height: 5' (1.524 m)  Weight: 190 lb (86.183 kg)  SpO2: 99%    CONSTITUTIONAL: Well developed/well nourished HEAD: Normocephalic/atraumatic EYES: EOMI/PERRL ENMT: Significant nasal congestion, throat is red, no adenopathy.  NECK: supple no meningeal signs SPINE/BACK:entire spine nontender CV: S1/S2 noted, no murmurs/rubs/gallops noted LUNGS: No wheezes but frequent cough, chest clear.  SKIN: warm, color normal PSYCH: no abnormalities of mood noted, alert and oriented to situation Results for orders placed or performed in visit on 03/24/16  POCT rapid strep A  Result Value Ref Range   Rapid Strep A Screen Negative Negative        Assessment & Plan:  Patient placed on prednisone taper along with Singulair at night. She will continue her Zyrtec and Flonase.I personally performed the services described in this documentation, which was scribed in my presence. The recorded information has been reviewed and is accurate.I personally performed the services described in this documentation, which was scribed in my presence. The recorded information has been reviewed and is accurate.  Darlyne Russian, MD

## 2016-03-26 LAB — CULTURE, GROUP A STREP: ORGANISM ID, BACTERIA: NORMAL

## 2016-05-15 ENCOUNTER — Encounter: Payer: Self-pay | Admitting: Family Medicine

## 2016-05-15 ENCOUNTER — Ambulatory Visit (INDEPENDENT_AMBULATORY_CARE_PROVIDER_SITE_OTHER): Payer: 59 | Admitting: Family Medicine

## 2016-05-15 VITALS — BP 130/74 | HR 72 | Temp 97.8°F | Resp 16 | Ht 60.0 in | Wt 180.0 lb

## 2016-05-15 DIAGNOSIS — Z1329 Encounter for screening for other suspected endocrine disorder: Secondary | ICD-10-CM | POA: Diagnosis not present

## 2016-05-15 DIAGNOSIS — Z130 Encounter for screening for diseases of the blood and blood-forming organs and certain disorders involving the immune mechanism: Secondary | ICD-10-CM

## 2016-05-15 DIAGNOSIS — Z113 Encounter for screening for infections with a predominantly sexual mode of transmission: Secondary | ICD-10-CM | POA: Diagnosis not present

## 2016-05-15 DIAGNOSIS — Z1211 Encounter for screening for malignant neoplasm of colon: Secondary | ICD-10-CM | POA: Diagnosis not present

## 2016-05-15 DIAGNOSIS — Z1389 Encounter for screening for other disorder: Secondary | ICD-10-CM | POA: Diagnosis not present

## 2016-05-15 DIAGNOSIS — Z1321 Encounter for screening for nutritional disorder: Secondary | ICD-10-CM

## 2016-05-15 DIAGNOSIS — Z136 Encounter for screening for cardiovascular disorders: Secondary | ICD-10-CM

## 2016-05-15 DIAGNOSIS — Z1383 Encounter for screening for respiratory disorder NEC: Secondary | ICD-10-CM

## 2016-05-15 DIAGNOSIS — I10 Essential (primary) hypertension: Secondary | ICD-10-CM

## 2016-05-15 DIAGNOSIS — Z1212 Encounter for screening for malignant neoplasm of rectum: Secondary | ICD-10-CM

## 2016-05-15 DIAGNOSIS — Z0000 Encounter for general adult medical examination without abnormal findings: Secondary | ICD-10-CM | POA: Diagnosis not present

## 2016-05-15 DIAGNOSIS — J302 Other seasonal allergic rhinitis: Secondary | ICD-10-CM | POA: Diagnosis not present

## 2016-05-15 LAB — CBC
HCT: 36.4 % (ref 35.0–45.0)
HEMOGLOBIN: 11.6 g/dL — AB (ref 11.7–15.5)
MCH: 27.4 pg (ref 27.0–33.0)
MCHC: 31.9 g/dL — AB (ref 32.0–36.0)
MCV: 85.8 fL (ref 80.0–100.0)
MPV: 9.1 fL (ref 7.5–12.5)
PLATELETS: 297 10*3/uL (ref 140–400)
RBC: 4.24 MIL/uL (ref 3.80–5.10)
RDW: 13.4 % (ref 11.0–15.0)
WBC: 5.1 10*3/uL (ref 3.8–10.8)

## 2016-05-15 LAB — POCT URINALYSIS DIP (MANUAL ENTRY)
Bilirubin, UA: NEGATIVE
Glucose, UA: NEGATIVE
Leukocytes, UA: NEGATIVE
Nitrite, UA: NEGATIVE
PH UA: 5.5
PROTEIN UA: NEGATIVE
SPEC GRAV UA: 1.015
Urobilinogen, UA: 0.2

## 2016-05-15 LAB — LIPID PANEL
CHOL/HDL RATIO: 3 ratio (ref <=5.0)
Cholesterol: 157 mg/dL (ref 125–200)
HDL: 53 mg/dL (ref >=46)
LDL CALC: 85 mg/dL (ref <130)
Triglycerides: 97 mg/dL (ref <150)
VLDL: 19 mg/dL (ref <30)

## 2016-05-15 LAB — COMPREHENSIVE METABOLIC PANEL
ALT: 9 U/L (ref 6–29)
AST: 12 U/L (ref 10–35)
Albumin: 4 g/dL (ref 3.6–5.1)
Alkaline Phosphatase: 82 U/L (ref 33–130)
BILIRUBIN TOTAL: 0.9 mg/dL (ref 0.2–1.2)
BUN: 8 mg/dL (ref 7–25)
CO2: 23 mmol/L (ref 20–31)
CREATININE: 0.71 mg/dL (ref 0.50–1.05)
Calcium: 9.5 mg/dL (ref 8.6–10.4)
Chloride: 102 mmol/L (ref 98–110)
GLUCOSE: 77 mg/dL (ref 65–99)
Potassium: 4 mmol/L (ref 3.5–5.3)
SODIUM: 139 mmol/L (ref 135–146)
Total Protein: 7.1 g/dL (ref 6.1–8.1)

## 2016-05-15 LAB — TSH: TSH: 1.22 mIU/L

## 2016-05-15 MED ORDER — MONTELUKAST SODIUM 10 MG PO TABS: 10.0000 mg | ORAL_TABLET | Freq: Every day | ORAL | Status: AC

## 2016-05-15 MED ORDER — TRIAMCINOLONE ACETONIDE 55 MCG/ACT NA AERO: 2.0000 | INHALATION_SPRAY | Freq: Every day | NASAL | Status: AC

## 2016-05-15 MED ORDER — AMLODIPINE BESYLATE 10 MG PO TABS: 10.0000 mg | ORAL_TABLET | Freq: Every day | ORAL | Status: AC

## 2016-05-15 MED ORDER — EPINEPHRINE 0.3 MG/0.3ML IJ SOAJ: 0.3000 mg | INTRAMUSCULAR | Status: AC | PRN

## 2016-05-15 NOTE — Progress Notes (Signed)
Subjective:    Patient ID: Mckenzie Hill, female    DOB: [birth year], 50 y.o.   MRN: [id number] Chief Complaint  Patient presents with  . Annual Exam    no pap    HPI  Mckenzie Hill is a 50 yo woman who presents for her CPE.   Primary preventative screening:  Cervical distant h/o laser treatment in the cervix around 2006 but paps nml since then. Pap nml 04/2014 - no HPV - now seeing gyn annually.  Fibroids - was on OCP to ob-gyn but did not like it. Mam: 2010 - had it done at GYN in May 2017, normal Imm: TDap 2010  HTN: on lisinopril-hctz then changed to amlodipine after an allergic reaction to seafood.  Checks BP at home 150/85 last night.  Mostly BP runs 125-130/75-80.  Allergic rhinitis: Using Nasacort and sinulair prn - last needed was April.  Uses benadryl prn - only when she has a reaction to something  Past Medical History  Diagnosis Date  . Hypertension   . Allergy   . Fibroids   . Dysmenorrhea    Past Surgical History  Procedure Laterality Date  . Laser ablation of the cervix    . Myomectomy     Current Outpatient Prescriptions on File Prior to Visit  Medication Sig Dispense Refill  . cetirizine (ZYRTEC) 10 MG tablet Take 10 mg by mouth daily as needed for allergies.     . diphenhydrAMINE (BENADRYL) 25 MG tablet Take 1 tablet (25 mg total) by mouth every 6 (six) hours as needed for itching or allergies. 45 tablet 0  . EPINEPHrine (EPIPEN) 0.3 mg/0.3 mL DEVI Inject 0.3 mLs (0.3 mg total) into the muscle as needed. 2 Device 0  . Fe Cbn-Fe Gluc-FA-B12-C-DSS (FERRALET 90) 90-1 MG TABS Take 90 mg by mouth daily. Reported on 05/15/2016  0   No current facility-administered medications on file prior to visit.   Allergies  Allergen Reactions  . Shellfish Allergy Anaphylaxis, Hives and Itching  . Chicken Allergy Rash   Family History  Problem Relation Age of Onset  . Alzheimer's disease Mother   . Hypertension Mother   . Diabetes Father   . Hypertension Father   . COPD Brother    . Heart attack Sister    Social History   Social History  . Marital Status: Married    Spouse Name: N/A  . Number of Children: N/A  . Years of Education: N/A   Occupational History  . Chemist    Social History Main Topics  . Smoking status: Never Smoker   . Smokeless tobacco: None  . Alcohol Use: Yes     Comment: occ  . Drug Use: No  . Sexual Activity: Yes    Birth Control/ Protection: None   Other Topics Concern  . None   Social History Narrative     Review of Systems See hpi    Objective:  BP 130/74 mmHg  Pulse 72  Temp(Src) 97.8 F (36.6 C) (Oral)  Resp 16  Ht 5' (1.524 m)  Wt 180 lb (81.647 kg)  BMI 35.15 kg/m2  SpO2 97%  LMP 04/09/2016 (Approximate)  Physical Exam  Constitutional: She is oriented to person, place, and time. She appears well-developed and well-nourished. No distress.  HENT:  Head: Normocephalic and atraumatic.  Right Ear: Tympanic membrane, external ear and ear canal normal.  Left Ear: Tympanic membrane, external ear and ear canal normal.  Nose: Nose normal. No mucosal edema or rhinorrhea.  Mouth/Throat:  Uvula is midline, oropharynx is clear and moist and mucous membranes are normal. No posterior oropharyngeal erythema.  Eyes: Conjunctivae and EOM are normal. Pupils are equal, round, and reactive to light. Right eye exhibits no discharge. Left eye exhibits no discharge. No scleral icterus.  Neck: Normal range of motion. Neck supple. No thyromegaly present.  Cardiovascular: Normal rate, regular rhythm, normal heart sounds and intact distal pulses.   Pulmonary/Chest: Effort normal and breath sounds normal. No respiratory distress.  Abdominal: Soft. Bowel sounds are normal. There is no tenderness.  Musculoskeletal: She exhibits no edema.  Lymphadenopathy:    She has no cervical adenopathy.  Neurological: She is alert and oriented to person, place, and time. She has normal reflexes.  Skin: Skin is warm and dry. She is not diaphoretic. No  erythema.  Psychiatric: She has a normal mood and affect. Her behavior is normal.         [facility] reading (PRIMARY) by  Dr. Brigitte Pulse. EKG: NSR Assessment & Plan:  Hiv, colonoscopy Cologuard, vti D, ua 1. Seasonal allergic rhinitis   2. Annual physical exam   3. Screening for cardiovascular, respiratory, and genitourinary diseases   4. Screening for colorectal cancer   5. Screening for deficiency anemia   6. Screening for thyroid disorder   7. Routine screening for STI (sexually transmitted infection)   8. Encounter for vitamin deficiency screening   9. Essential hypertension     Orders Placed This Encounter  Procedures  . CBC  . Comprehensive metabolic panel    Order Specific Question:  Has the patient fasted?    Answer:  Yes  . TSH  . VITAMIN D 25 Hydroxy (Vit-D Deficiency, Fractures)  . Lipid panel    Order Specific Question:  Has the patient fasted?    Answer:  Yes  . Cologuard  . HIV antibody  . POCT urinalysis dipstick  . EKG 12-Lead    Meds ordered this encounter  Medications  . triamcinolone (NASACORT AQ) 55 MCG/ACT AERO nasal inhaler    Sig: Place 2 sprays into the nose daily.    Dispense:  1 Inhaler    Refill:  12  . amLODipine (NORVASC) 10 MG tablet    Sig: Take 1 tablet (10 mg total) by mouth daily.    Dispense:  90 tablet    Refill:  3  . EPINEPHrine 0.3 mg/0.3 mL IJ SOAJ injection    Sig: Inject 0.3 mLs (0.3 mg total) into the muscle as needed.    Dispense:  2 Device    Refill:  1  . montelukast (SINGULAIR) 10 MG tablet    Sig: Take 1 tablet (10 mg total) by mouth at bedtime.    Dispense:  90 tablet    Refill:  3    Delman Cheadle, M.D.  [facility] [address] [place], [place] [address] [phone] phone [phone] fax  05/21/2016 8:14 AM

## 2016-05-15 NOTE — Patient Instructions (Addendum)
IF you received an x-ray today, you will receive an invoice from [facility]. Please contact [facility] at [phone] with questions or concerns regarding your invoice.   IF you received labwork today, you will receive an invoice from [employer]. Please contact [facility] at [phone] with questions or concerns regarding your invoice.   Our billing staff will not be able to assist you with questions regarding bills from these companies.  You will be contacted with the lab results as soon as they are available. The fastest way to get your results is to activate your My Chart account. Instructions are located on the last page of this paperwork. If you have not heard from [place] regarding the results in 2 weeks, please contact this office.    Colorectal Cancer Screening Colorectal cancer screening is a group of tests used to check for colorectal cancer. Colorectal refers to your colon and rectum. Your colon and rectum are located at the end of your large intestine and carry your bowel movements out of your body.  WHY IS COLORECTAL CANCER SCREENING DONE? It is common for abnormal growths (polyps) to form in the lining of your colon, especially as you get older. These polyps can be cancerous or become cancerous. If colorectal cancer is found at an early stage, it is treatable. WHO SHOULD BE SCREENED FOR COLORECTAL CANCER? Screening is recommended for all adults at average risk starting at age 5. Tests may be recommended every 1 to 10 years. Your health care provider may recommend earlier or more frequent screening if you have: 1. A history of colorectal cancer or polyps. 2. A family member with a history of colorectal cancer or polyps. 3. Inflammatory bowel disease, such as ulcerative colitis or Crohn disease. 4. A type of hereditary colon cancer syndrome. 5. Colorectal cancer symptoms. TYPES OF SCREENING TESTS There are several types of  colorectal screening tests. They include: 1. Guaiac-based fecal occult blood testing. 2. Fecal immunochemical test (FIT). 3. Stool DNA test. 4. Barium enema. 5. Virtual colonoscopy. 6. Sigmoidoscopy. During this test, a sigmoidoscope is used to examine your rectum and lower colon. A sigmoidoscope is a flexible tube with a camera that is inserted through your anus into your rectum and lower colon. 7. Colonoscopy. During this test, a colonoscope is used to examine your entire colon. A colonoscope is a long, thin, flexible tube with a camera. This test examines your entire colon and rectum.   This information is not intended to replace advice given to you by your health care provider. Make sure you discuss any questions you have with your health care provider.   Document Released: 04/23/2010 Document Revised: 11/24/2014 Document Reviewed: 02/09/2014 Elsevier Interactive Patient Education 2016 [employer]  Get These Tests  Blood Pressure- Have your blood pressure checked by your healthcare provider at least once a year.  Normal blood pressure is 120/80.  Weight- Have your body mass index (BMI) calculated to screen for obesity.  BMI is a measure of body fat based on height and weight.  You can calculate your own BMI at [employer]  Cholesterol- Have your cholesterol checked every year.  Diabetes- Have your blood sugar checked every year if you have high blood pressure, high cholesterol, a family history of diabetes or if you are overweight.  Pap Test - Have a pap test every 1 to 5 years if you have been sexually active.  If you are older than 38 and recent  pap tests have been normal you may not need additional pap tests.  In addition, if you have had a hysterectomy  for benign disease additional pap tests are not necessary.  Mammogram-Yearly mammograms are essential for early detection of breast cancer  Screening for Colon Cancer- Colonoscopy starting at  age 71. Screening may begin sooner depending on your family history and other health conditions.  Follow up colonoscopy as directed by your Gastroenterologist.  Screening for Osteoporosis- Screening begins at age 79 with bone density scanning, sooner if you are at higher risk for developing Osteoporosis.  Get these medicines  Calcium with Vitamin D- Your body requires 1200-1500 mg of Calcium a day and [phone] IU of Vitamin D a day.  You can only absorb 500 mg of Calcium at a time therefore Calcium must be taken in 2 or 3 separate doses throughout the day.  Hormones- Hormone therapy has been associated with increased risk for certain cancers and heart disease.  Talk to your healthcare provider about if you need relief from menopausal symptoms.  Aspirin- Ask your healthcare provider about taking Aspirin to prevent Heart Disease and Stroke.  Get these Immuniztions  Flu shot- Every fall  Pneumonia shot- Once after the age of 26; if you are younger ask your healthcare provider if you need a pneumonia shot.  Tetanus- Every ten years.  Zostavax- Once after the age of 4 to prevent shingles.  Take these steps  Don't smoke- Your healthcare provider can help you quit. For tips on how to quit, ask your healthcare provider or go to www.smokefree.gov or call 1-800 QUIT-NOW.  Be physically active- Exercise 5 days a week for a minimum of 30 minutes.  If you are not already physically active, start slow and gradually work up to 30 minutes of moderate physical activity.  Try walking, dancing, bike riding, swimming, etc.  Eat a healthy diet- Eat a variety of healthy foods such as fruits, vegetables, whole grains, low fat milk, low fat cheeses, yogurt, lean meats, chicken, fish, eggs, dried beans, tofu, etc.  For more information go to www.thenutritionsource.org  Dental visit- Brush and floss teeth twice daily; visit your dentist twice a year.  Eye exam- Visit your Optometrist or Ophthalmologist  yearly.  Drink alcohol in moderation- Limit alcohol intake to one drink or less a day.  Never drink and drive.  Depression- Your emotional health is as important as your physical health.  If you're feeling down or losing interest in things you normally enjoy, please talk to your healthcare provider.  Seat Belts- can save your life; always wear one  Smoke/Carbon Monoxide detectors- These detectors need to be installed on the appropriate level of your home.  Replace batteries at least once a year.  Violence- If anyone is threatening or hurting you, please tell your healthcare provider.  Living Will/ Health care power of attorney- Discuss with your healthcare provider and family.

## 2016-05-16 LAB — VITAMIN D 25 HYDROXY (VIT D DEFICIENCY, FRACTURES): Vit D, 25-Hydroxy: 25 ng/mL — ABNORMAL LOW (ref 30–100)

## 2016-05-16 LAB — HIV ANTIBODY (ROUTINE TESTING W REFLEX): HIV: NONREACTIVE

## 2016-06-06 ENCOUNTER — Encounter: Payer: BLUE CROSS/BLUE SHIELD | Admitting: Gastroenterology

## 2016-07-24 ENCOUNTER — Telehealth: Payer: Self-pay

## 2016-07-24 NOTE — Telephone Encounter (Signed)
Left detailed message from Dr. Everlene Farrier regarding Cologuard.

## 2018-11-17 ENCOUNTER — Encounter (HOSPITAL_COMMUNITY): Payer: Self-pay | Admitting: *Deleted

## 2018-11-17 ENCOUNTER — Emergency Department (HOSPITAL_COMMUNITY)
Admission: EM | Admit: 2018-11-17 | Discharge: 2018-11-17 | Disposition: A | Discharge status: Home / Self Care | Payer: 59 | Attending: Emergency Medicine | Admitting: Emergency Medicine

## 2018-11-17 ENCOUNTER — Other Ambulatory Visit: Payer: Self-pay

## 2018-11-17 ENCOUNTER — Emergency Department (HOSPITAL_COMMUNITY): Payer: 59

## 2018-11-17 DIAGNOSIS — I10 Essential (primary) hypertension: Secondary | ICD-10-CM | POA: Insufficient documentation

## 2018-11-17 DIAGNOSIS — Z79899 Other long term (current) drug therapy: Secondary | ICD-10-CM | POA: Insufficient documentation

## 2018-11-17 DIAGNOSIS — M25562 Pain in left knee: Secondary | ICD-10-CM

## 2018-11-17 NOTE — ED Triage Notes (Signed)
Pt states inner leg near knee started to hurt yesterday, may have twisted knee a little when when she bent to pick up purse.

## 2018-11-17 NOTE — ED Provider Notes (Signed)
[facility] DEPT Provider Note   CSN: [id number] Arrival date & time: 11/17/18  1317     History   Chief Complaint Chief Complaint  Patient presents with  . Knee Pain    left    HPI Mckenzie Hill is a 53 y.o. female.  HPI   Pt is a 53 y/o female with a h/o allergies, dysmenorrhea, fibroids, htn, who presents emergency department today complaining of sudden onset left knee pain occurred yesterday.  States that she bent over to pick something up off the ground and she had sudden onset of pain to that medial aspect of the left knee.  States she has been swelling there.  She is ice yesterday which improved her symptoms somewhat.  She did have some redness there yesterday that has improved.  She has been ambulatory today but is limping.  No numbness.  Lower extremity swelling.  Denies CP, SOB, hemoptysis, recent surgery/trauma, recent long travel, hormone use, personal hx of cancer, or hx of DVT/PE.   Past Medical History:  Diagnosis Date  . Allergy   . Dysmenorrhea   . Fibroids   . Hypertension     Patient Active Problem List   Diagnosis Date Noted  . Seasonal allergic rhinitis 03/07/2015  . Obesity 03/07/2015  . Obesity, unspecified 05/15/2014  . Allergic reaction 12/11/2013  . HTN (hypertension) 12/11/2013    Past Surgical History:  Procedure Laterality Date  . LASER ABLATION OF THE CERVIX    . MYOMECTOMY       OB History   No obstetric history on file.      Home Medications    Prior to Admission medications   Medication Sig Start Date End Date Taking? Authorizing Provider  amLODipine (NORVASC) 10 MG tablet Take 1 tablet (10 mg total) by mouth daily. 05/15/16   Shawnee Knapp, MD  cetirizine (ZYRTEC) 10 MG tablet Take 10 mg by mouth daily as needed for allergies.     [provider]  diphenhydrAMINE (BENADRYL) 25 MG tablet Take 1 tablet (25 mg total) by mouth every 6 (six) hours as needed for itching or allergies. 12/11/13    Robbie Lis, MD  EPINEPHrine (EPIPEN) 0.3 mg/0.3 mL DEVI Inject 0.3 mLs (0.3 mg total) into the muscle as needed. 01/05/12   Junius Creamer, NP  EPINEPHrine 0.3 mg/0.3 mL IJ SOAJ injection Inject 0.3 mLs (0.3 mg total) into the muscle as needed. 05/15/16   Shawnee Knapp, MD  Fe Cbn-Fe Gluc-FA-B12-C-DSS (FERRALET 90) 90-1 MG TABS Take 90 mg by mouth daily. Reported on 05/15/2016 09/07/15   [provider]  montelukast (SINGULAIR) 10 MG tablet Take 1 tablet (10 mg total) by mouth at bedtime. 05/15/16   Shawnee Knapp, MD  triamcinolone (NASACORT AQ) 55 MCG/ACT AERO nasal inhaler Place 2 sprays into the nose daily. 05/15/16   Shawnee Knapp, MD    Family History Family History  Problem Relation Age of Onset  . Alzheimer's disease Mother   . Hypertension Mother   . Diabetes Father   . Hypertension Father   . COPD Brother   . Heart attack Sister     Social History Social History   Tobacco Use  . Smoking status: Never Smoker  . Smokeless tobacco: Never Used  Substance Use Topics  . Alcohol use: Yes    Comment: occ  . Drug use: No     Allergies   Shellfish allergy and Chicken allergy   Review of Systems Review  of Systems  Constitutional: Negative for fever.  Musculoskeletal: Positive for joint swelling.       Left knee pain  Skin: Positive for color change (improved).  Neurological: Negative for weakness and numbness.     Physical Exam Updated Vital Signs BP (!) 174/99   Pulse 84   Temp 98.6 F (37 C) (Oral)   Resp 18   Ht 5' (1.524 m)   Wt 79.4 kg   SpO2 96%   BMI 34.18 kg/m   Physical Exam Vitals signs and nursing note reviewed.  Constitutional:      General: She is not in acute distress.    Appearance: She is well-developed.  HENT:     Head: Normocephalic and atraumatic.  Eyes:     Conjunctiva/sclera: Conjunctivae normal.  Neck:     Musculoskeletal: Neck supple.  Cardiovascular:     Rate and Rhythm: Normal rate.     Heart sounds: Normal heart sounds.  No murmur.  Pulmonary:     Effort: Pulmonary effort is normal.     Breath sounds: Normal breath sounds. No wheezing.  Abdominal:     General: Bowel sounds are normal.     Palpations: Abdomen is soft.     Tenderness: There is no abdominal tenderness.  Musculoskeletal:     Comments: TTP to the medial joint line on the left knee. No TTP to the patella or lateral joint like. Mild 2cmx2cm area of erythema present where pt had ice pack. No significant warmth. No obvious joint effusion. FROM of the knee. Ambulatory with limp. No posterior knee TTP or TTP to the calf. No LE swelling.  Skin:    General: Skin is warm and dry.  Neurological:     Mental Status: She is alert.      ED Treatments / Results  Labs (all labs ordered are listed, but only abnormal results are displayed) Labs Reviewed - No data to display  EKG None  Radiology Dg Knee Complete 4 Views Left  Result Date: 11/17/2018 CLINICAL DATA:  Status post trauma to the left knee with left knee pain. EXAM: LEFT KNEE - COMPLETE 4+ VIEW COMPARISON:  None. FINDINGS: No evidence of fracture, dislocation, or joint effusion. Narrow medial femoral tibial joint space is identified. Soft tissues are unremarkable. IMPRESSION: No acute fracture or dislocation. Degenerative joint changes of the right knee. Electronically Signed   By: Abelardo Diesel M.D.   On: 11/17/2018 14:22    Procedures Procedures (including critical care time)  Medications Ordered in ED Medications - No data to display   Initial Impression / Assessment and Plan / ED Course  I have reviewed the triage vital signs and the nursing notes.  Pertinent labs & imaging results that were available during my care of the patient were reviewed by me and considered in my medical decision making (see chart for details).    Final Clinical Impressions(s) / ED Diagnoses   Final diagnoses:  Acute pain of left knee   Patient presented the emergency department today complaining of  left knee pain that began after she bent down and stood back up yesterday.  Has had continued pain today.  Does have full range of motion.  No obvious joint effusion on exam or x-ray.  No significant warmth to suggest septic arthritis.  No fevers.  She has tenderness along the medial joint line of the knee.  She also has increased pain with varus stressing of the left knee.  He is ambulatory with a limp.  She does not have any calf tenderness or swelling.  No risk factors for DVT.  I have low suspicion for DVT.  Discussed possibility of obtaining ultrasound with patient however she prefers to forego ultrasound at this time which I feel is reasonable.  X-ray did not show any acute bony abnormality but did show narrow medial femoral tibial joint space.  Provided patient with knee sleeve.  Referral given for orthopedics.  Patient advised to follow-up with Ortho for continued pain.  Advised to return to the ER for new or worsening symptoms.  She voices understanding the plan and reasons to return the ED.  All questions answered.  ED Discharge Orders    None       Bishop Dublin 11/17/18 1645    Dorie Rank, MD 11/20/18 [phone]

## 2018-11-17 NOTE — Discharge Instructions (Signed)

## 2018-11-24 ENCOUNTER — Encounter (INDEPENDENT_AMBULATORY_CARE_PROVIDER_SITE_OTHER): Payer: Self-pay | Admitting: Physician Assistant

## 2018-11-24 ENCOUNTER — Ambulatory Visit (INDEPENDENT_AMBULATORY_CARE_PROVIDER_SITE_OTHER): Payer: 59 | Admitting: Physician Assistant

## 2018-11-24 DIAGNOSIS — M1712 Unilateral primary osteoarthritis, left knee: Secondary | ICD-10-CM | POA: Diagnosis not present

## 2018-11-24 NOTE — Progress Notes (Signed)
Office Visit Note   Patient: Mckenzie Hill           Date of Birth: November 27, 1965           MRN: [id number] Visit Date: 11/24/2018              Requested by: Hayden Rasmussen, MD [address] [place], [place] [address] PCP: Hayden Rasmussen, MD   Assessment & Plan: Visit Diagnoses:  1. Unilateral primary osteoarthritis, left knee     Plan: Discussed with Mckenzie Hill knee friendly exercises.  She shown quad strengthening exercises today in the office.  Offered formal physical therapy to get a home exercise program she defers.  Offered her a steroid injection left knee she defers.  Therefore we will have her work on exercise particular quad strengthening, weight loss and take over-the-counter NSAIDs and possibly try a natural anti-inflammatory.  I discussed with her did not want her to take Advil or any over-the-counter NSAID on a long-term basis due to risk of GI bleed and kidney damage.  She will follow-up with [place] on as-needed basis.  Questions encouraged and answered at length  Follow-Up Instructions: Return if symptoms worsen or fail to improve.   Orders:  No orders of the defined types were placed in this encounter.  No orders of the defined types were placed in this encounter.     Procedures: No procedures performed   Clinical Data: No additional findings.   Subjective: Chief Complaint  Patient presents with  . Left Knee - Pain    HPI Mckenzie Hill is a 53 year old female who is being seen today for left knee pain.  States that started having pain left knee on 11/16/2018 after he was bending down.  She went to go stand back up and felt that she had a severe muscle spasm.  She was seen in the ER on 11/17/2018 where radiographs were obtained.  I personally reviewed these radiographs and they showed no acute fracture.  She has moderate medial compartmental narrowing and mild to moderate patellofemoral changes.  Otherwise no bony abnormalities. States that her knee pain is  became better over the last week.  She denies any mechanical symptoms of knee or significant swelling in the knee.  No other injuries to the knee outside the stated above.  She is been trying ice Advil knee sleeve that helps some. Review of Systems Please see HPI otherwise negative  Objective: Vital Signs: There were no vitals taken for this visit.  Physical Exam General: Well-developed well-nourished female no acute distress mood affect appropriate Psych: Alert and oriented x3  Ortho Exam Bilateral knees she has full extension full flexion.  She has tenderness along medial joint line of both knees.  Tenderness along the lateral joint line of the left knee.  McMurray's negative bilaterally.  No instability valgus varus stressing of either knee.  Osmond-Clarke negative bilaterally. Specialty Comments:  No specialty comments available.  Imaging: No results found.   PMFS History: Patient Active Problem List   Diagnosis Date Noted  . Seasonal allergic rhinitis 03/07/2015  . Obesity 03/07/2015  . Obesity, unspecified 05/15/2014  . Allergic reaction 12/11/2013  . HTN (hypertension) 12/11/2013   Past Medical History:  Diagnosis Date  . Allergy   . Dysmenorrhea   . Fibroids   . Hypertension     Family History  Problem Relation Age of Onset  . Alzheimer's disease Mother   . Hypertension Mother   . Diabetes Father   .  Hypertension Father   . COPD Brother   . Heart attack Sister     Past Surgical History:  Procedure Laterality Date  . LASER ABLATION OF THE CERVIX    . MYOMECTOMY     Social History   Occupational History  . Occupation: Training and development officer: [employer]  Tobacco Use  . Smoking status: Never Smoker  . Smokeless tobacco: Never Used  Substance and Sexual Activity  . Alcohol use: Yes    Comment: occ  . Drug use: No  . Sexual activity: Yes    Birth control/protection: None

## 2019-10-29 ENCOUNTER — Other Ambulatory Visit: Payer: Self-pay

## 2019-10-29 DIAGNOSIS — Z20822 Contact with and (suspected) exposure to covid-19: Secondary | ICD-10-CM

## 2019-10-30 LAB — NOVEL CORONAVIRUS, NAA: SARS-CoV-2, NAA: NOT DETECTED

## 2020-09-16 IMAGING — CR DG KNEE COMPLETE 4+V*L*
4 series · 4 of 4 positions shown · non-contrast
Comparison: None.

CLINICAL DATA: Status post trauma to the left knee with left knee
pain.

EXAM:
LEFT KNEE - COMPLETE 4+ VIEW

[x knee ap left]
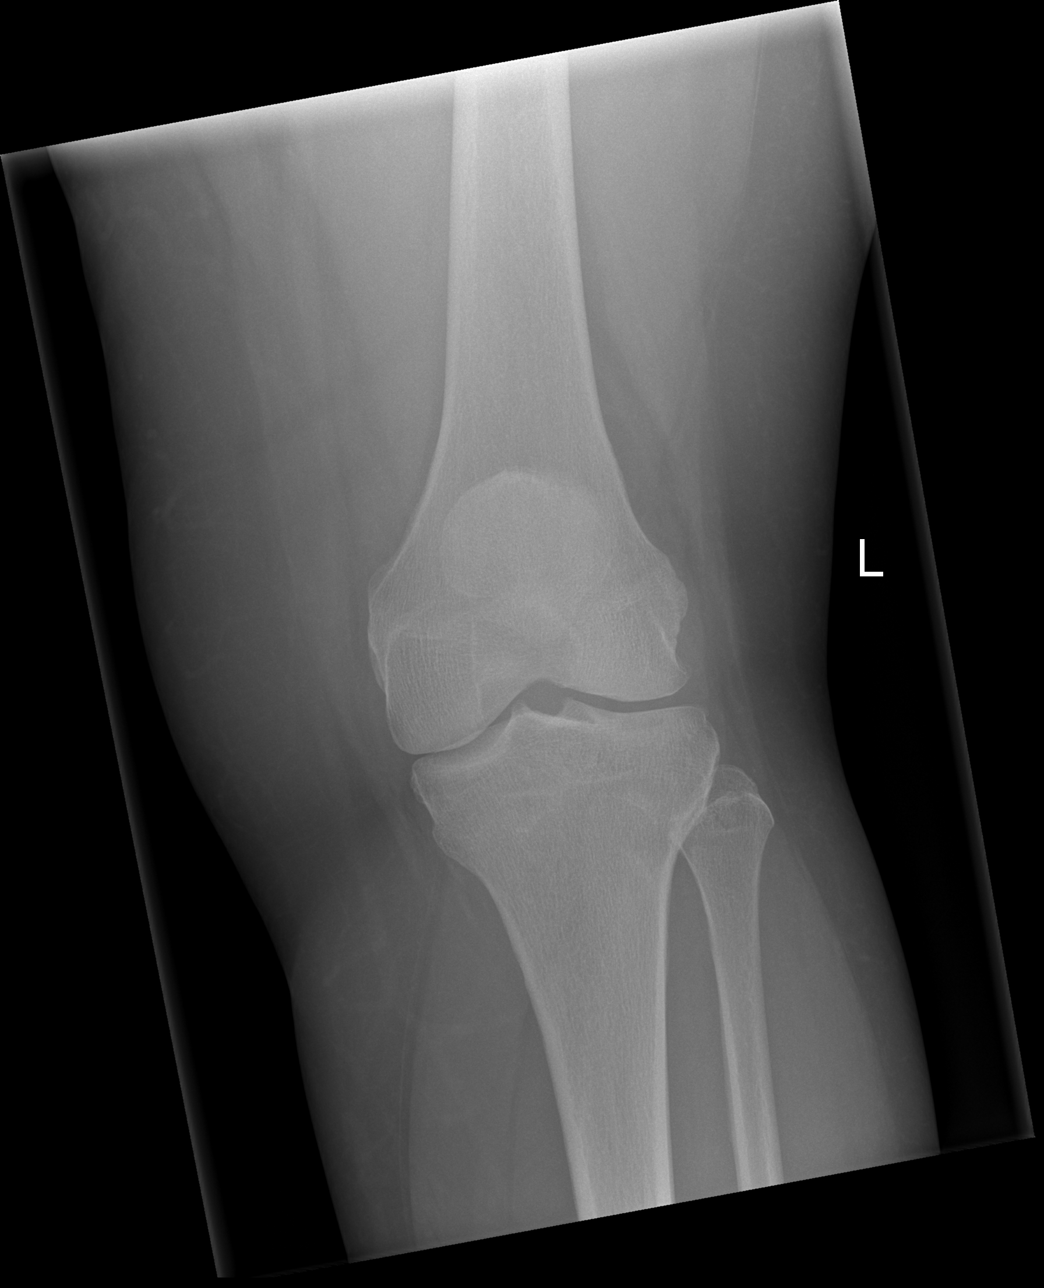

[x knee obl left (1 of 2)]
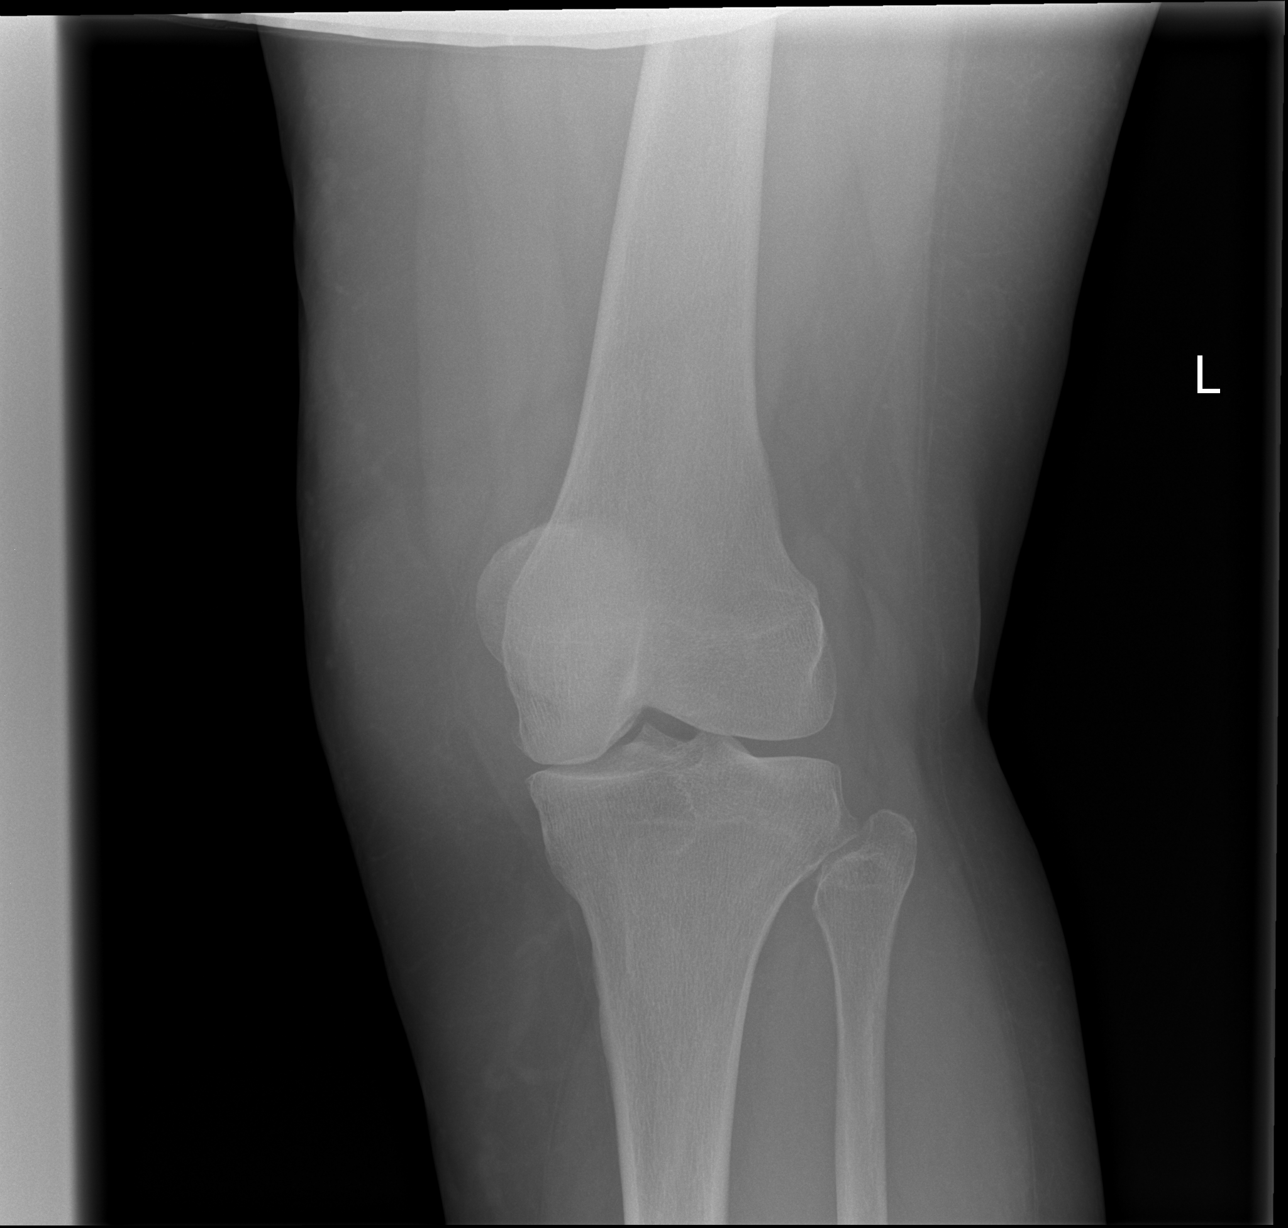

[x knee obl left (2 of 2)]
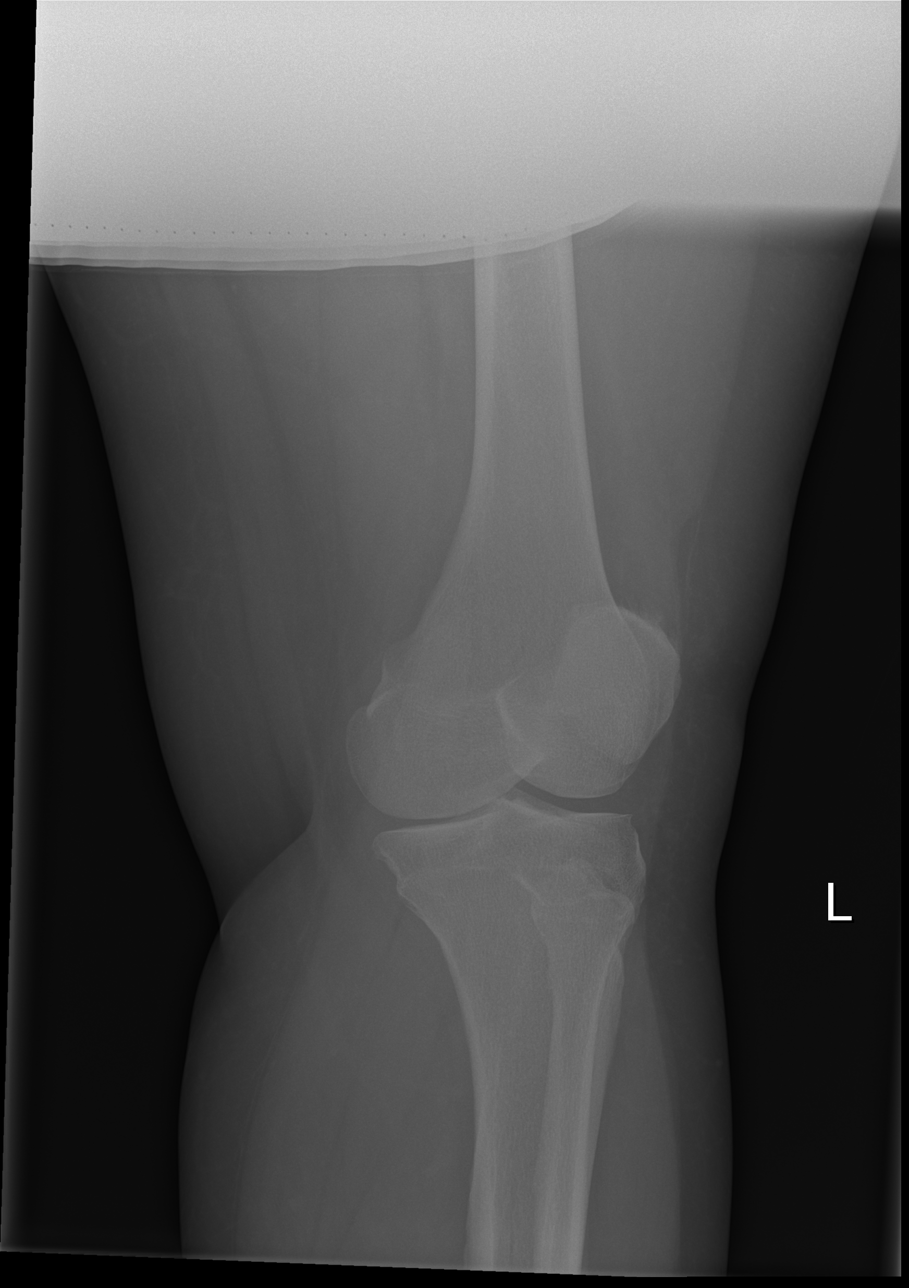

[x knee lat left]
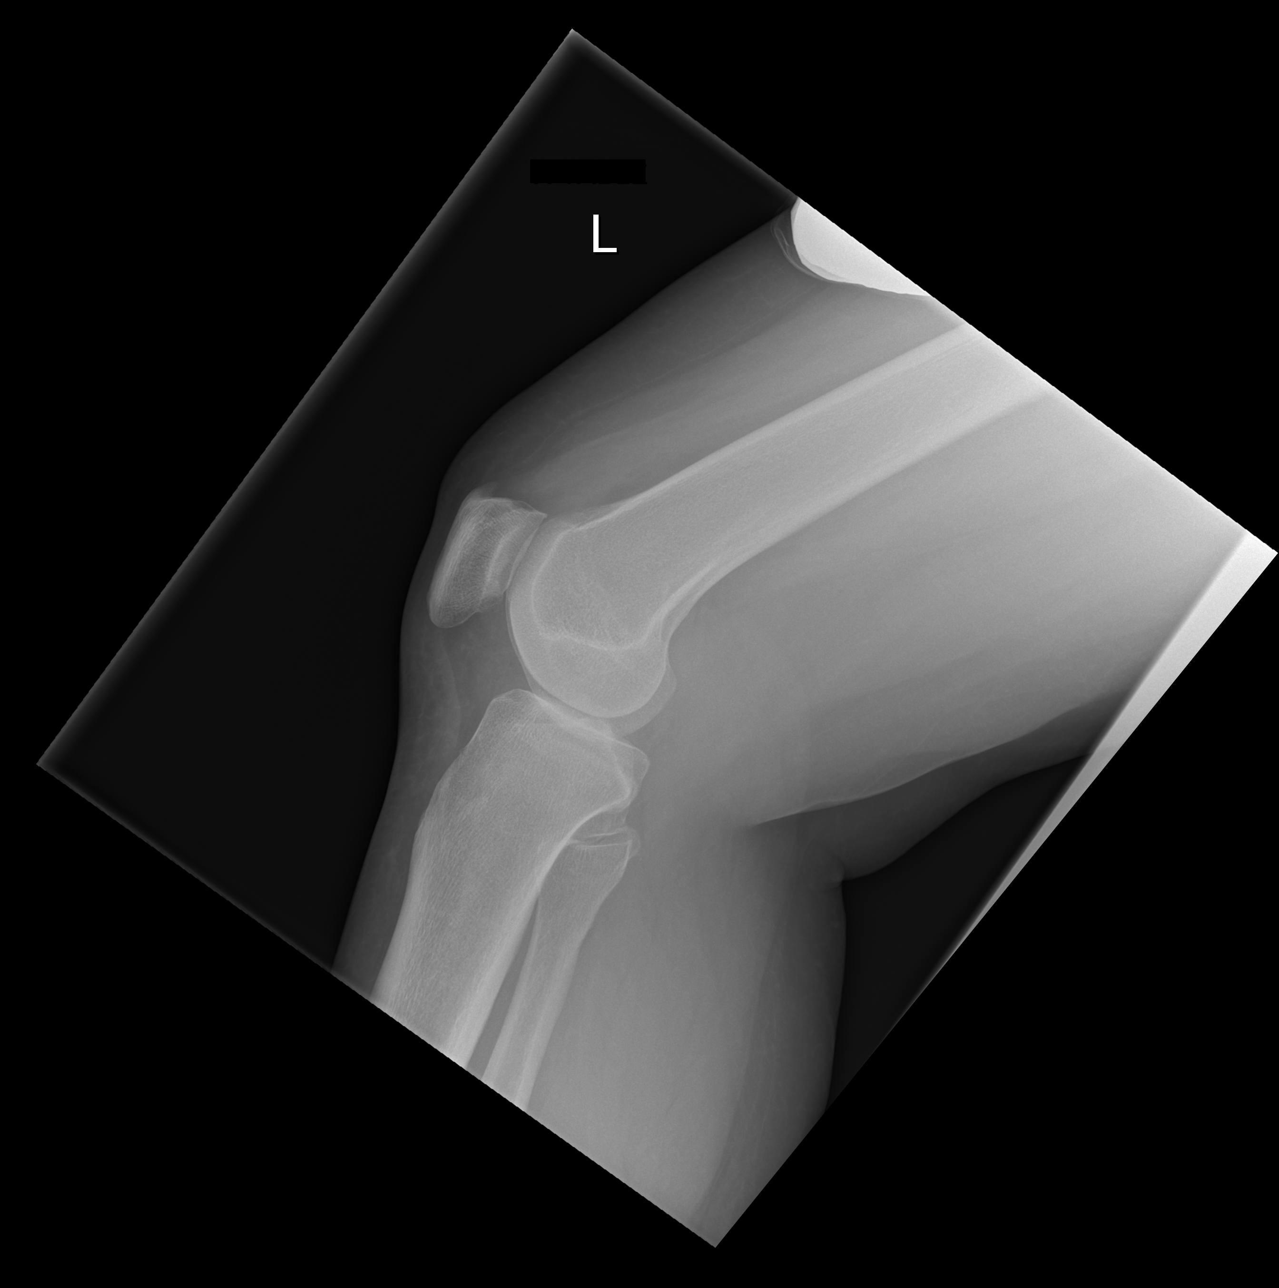

[4 of 4 positions shown; findings below may reference images not displayed]

FINDINGS: No evidence of fracture, dislocation, or joint effusion. Narrow
medial femoral tibial joint space is identified. Soft tissues are
unremarkable.
IMPRESSION: No acute fracture or dislocation. Degenerative joint changes of the
right knee.

## 2020-09-19 ENCOUNTER — Emergency Department (HOSPITAL_COMMUNITY): Payer: 59

## 2020-09-19 ENCOUNTER — Other Ambulatory Visit: Payer: Self-pay

## 2020-09-19 ENCOUNTER — Encounter (HOSPITAL_COMMUNITY): Payer: Self-pay | Admitting: Emergency Medicine

## 2020-09-19 ENCOUNTER — Emergency Department (HOSPITAL_COMMUNITY)
Admission: EM | Admit: 2020-09-19 | Discharge: 2020-09-19 | Disposition: A | Discharge status: Home / Self Care | Payer: 59 | Attending: Emergency Medicine | Admitting: Emergency Medicine

## 2020-09-19 DIAGNOSIS — R079 Chest pain, unspecified: Secondary | ICD-10-CM | POA: Diagnosis present

## 2020-09-19 LAB — BASIC METABOLIC PANEL
Anion gap: 11 (ref 5–15)
BUN: 20 mg/dL (ref 6–20)
CO2: 26 mmol/L (ref 22–32)
Calcium: 9.7 mg/dL (ref 8.9–10.3)
Chloride: 101 mmol/L (ref 98–111)
Creatinine, Ser: 0.88 mg/dL (ref 0.44–1.00)
GFR, Estimated: >60 mL/min (ref >60)
Glucose, Bld: 113 mg/dL — ABNORMAL HIGH (ref 70–99)
Potassium: 3.8 mmol/L (ref 3.5–5.1)
Sodium: 138 mmol/L (ref 135–145)

## 2020-09-19 LAB — I-STAT BETA HCG BLOOD, ED (MC, WL, AP ONLY): I-stat hCG, quantitative: <5 m[IU]/mL (ref <5)

## 2020-09-19 LAB — CBC
HCT: 41.1 % (ref 36.0–46.0)
Hemoglobin: 13.1 g/dL (ref 12.0–15.0)
MCH: 28.7 pg (ref 26.0–34.0)
MCHC: 31.9 g/dL (ref 30.0–36.0)
MCV: 89.9 fL (ref 80.0–100.0)
Platelets: 296 10*3/uL (ref 150–400)
RBC: 4.57 MIL/uL (ref 3.87–5.11)
RDW: 13 % (ref 11.5–15.5)
WBC: 6.7 10*3/uL (ref 4.0–10.5)
nRBC: 0 % (ref 0.0–0.2)

## 2020-09-19 LAB — TROPONIN I (HIGH SENSITIVITY)
Troponin I (High Sensitivity): 4 ng/L (ref <18)
Troponin I (High Sensitivity): 3 ng/L (ref <18)

## 2020-09-19 NOTE — ED Provider Notes (Signed)
[facility] EMERGENCY DEPARTMENT Provider Note   CSN: [id number] Arrival date & time: 09/19/20  0115     History Chief Complaint  Patient presents with  . Chest Pain    Mckenzie Hill is a 54 y.o. female.  HPI Patient is a 54 year old female with a past medical history of fibroids, obesity, hypertension  Patient is presented to emergency department today for chest pain which she states is central, intermittent for the past 3 months, nonradiating, seems to be somewhat worse with twisting and lifting.  She states that she has a separate area of pain in her left shoulder and neck which began over the weekend.  She states it is achy and worse with movement.  She states that she feels mildly short of breath this seems to be baseline for patient she states that she believes it is from being overweight.  She denies any hemoptysis, does endorse occasional nausea but no emesis or diaphoresis.  She has not had any fevers or chills.  She has not been around any sick contacts that she knows of.  She denies any other aggravating or mitigating factors.  She states that symptoms are nonexertional.  She denies any history of VTE, no recent surgeries, immobilization, cancer.  She is not a smoker.  She does have a first-degree relative-her sister-who had a heart attack at age 36.     Past Medical History:  Diagnosis Date  . Allergy   . Dysmenorrhea   . Fibroids   . Hypertension     Patient Active Problem List   Diagnosis Date Noted  . Seasonal allergic rhinitis 03/07/2015  . Obesity 03/07/2015  . Obesity, unspecified 05/15/2014  . Allergic reaction 12/11/2013  . HTN (hypertension) 12/11/2013    Past Surgical History:  Procedure Laterality Date  . LASER ABLATION OF THE CERVIX    . MYOMECTOMY       OB History   No obstetric history on file.     Family History  Problem Relation Age of Onset  . Alzheimer's disease Mother   . Hypertension Mother   . Diabetes Father    . Hypertension Father   . COPD Brother   . Heart attack Sister     Social History   Tobacco Use  . Smoking status: Never Smoker  . Smokeless tobacco: Never Used  Substance Use Topics  . Alcohol use: Yes    Comment: occ  . Drug use: No    Home Medications Prior to Admission medications   Medication Sig Start Date End Date Taking? Authorizing Provider  amLODipine (NORVASC) 10 MG tablet Take 1 tablet (10 mg total) by mouth daily. 05/15/16   Shawnee Knapp, MD  cetirizine (ZYRTEC) 10 MG tablet Take 10 mg by mouth daily as needed for allergies.     [provider]  diphenhydrAMINE (BENADRYL) 25 MG tablet Take 1 tablet (25 mg total) by mouth every 6 (six) hours as needed for itching or allergies. 12/11/13   Robbie Lis, MD  EPINEPHrine (EPIPEN) 0.3 mg/0.3 mL DEVI Inject 0.3 mLs (0.3 mg total) into the muscle as needed. 01/05/12   Junius Creamer, NP  EPINEPHrine 0.3 mg/0.3 mL IJ SOAJ injection Inject 0.3 mLs (0.3 mg total) into the muscle as needed. 05/15/16   Shawnee Knapp, MD  Fe Cbn-Fe Gluc-FA-B12-C-DSS (FERRALET 90) 90-1 MG TABS Take 90 mg by mouth daily. Reported on 05/15/2016 09/07/15   [provider]  montelukast (SINGULAIR) 10 MG tablet Take 1 tablet (  10 mg total) by mouth at bedtime. 05/15/16   Shawnee Knapp, MD  triamcinolone (NASACORT AQ) 55 MCG/ACT AERO nasal inhaler Place 2 sprays into the nose daily. 05/15/16   Shawnee Knapp, MD    Allergies    Shellfish allergy and Chicken allergy  Review of Systems   Review of Systems  Constitutional: Negative for fever.  HENT: Negative for congestion.   Respiratory: Negative for cough, chest tightness and shortness of breath.   Cardiovascular: Positive for chest pain. Negative for palpitations and leg swelling.  Gastrointestinal: Negative for abdominal distention, abdominal pain, diarrhea, nausea and vomiting.  Genitourinary: Negative for dysuria and hematuria.  Musculoskeletal: Negative for myalgias.  Skin: Negative for rash.   Neurological: Negative for dizziness and headaches.  Psychiatric/Behavioral: Negative for agitation.    Physical Exam Updated Vital Signs BP 130/85 (BP Location: Right Arm)   Pulse 80   Temp 98.4 F (36.9 C) (Oral)   Resp 18   Ht 5\' 1"  (1.549 m)   Wt 95 kg   SpO2 100%   BMI 39.57 kg/m   Physical Exam Vitals and nursing note reviewed.  Constitutional:      General: She is not in acute distress. HENT:     Head: Normocephalic and atraumatic.     Nose: Nose normal.     Mouth/Throat:     Mouth: Mucous membranes are moist.  Eyes:     General: No scleral icterus. Cardiovascular:     Rate and Rhythm: Normal rate and regular rhythm.     Pulses: Normal pulses.     Heart sounds: Normal heart sounds.     Comments: Pulses 3+ and symmetric in bilateral upper extremities Pulmonary:     Effort: Pulmonary effort is normal. No respiratory distress.     Breath sounds: No wheezing.     Comments: Some tenderness to palpation of the anterior chest wall Abdominal:     Palpations: Abdomen is soft.     Tenderness: There is no abdominal tenderness. There is no guarding or rebound.  Musculoskeletal:     Cervical back: Normal range of motion.     Right lower leg: No edema.     Left lower leg: No edema.     Comments: Tenderness to palpation of the left shoulder at the lateral deltoid. No significant bony tenderness. No step-off deformity no bruising or evidence of trauma.  Skin:    General: Skin is warm and dry.     Capillary Refill: Capillary refill takes less than 2 seconds.  Neurological:     Mental Status: She is alert. Mental status is at baseline.  Psychiatric:        Mood and Affect: Mood normal.        Behavior: Behavior normal.     ED Results / Procedures / Treatments   Labs (all labs ordered are listed, but only abnormal results are displayed) Labs Reviewed  BASIC METABOLIC PANEL - Abnormal; Notable for the following components:      Result Value   Glucose, Bld 113 (*)     All other components within normal limits  CBC  I-STAT BETA HCG BLOOD, ED ([facility], [facility], [facility] ONLY)  TROPONIN I (HIGH SENSITIVITY)  TROPONIN I (HIGH SENSITIVITY)    EKG EKG Interpretation  Date/Time:  Wednesday September 19 2020 01:18:57 EDT Ventricular Rate:  86 PR Interval:  170 QRS Duration: 66 QT Interval:  366 QTC Calculation: 437 R Axis:   76 Text Interpretation: Normal sinus rhythm Right  atrial enlargement Borderline ECG Confirmed by Addison Lank [phone]) on 09/19/2020 3:38:00 AM   Radiology DG Chest 2 View  Result Date: 09/19/2020 CLINICAL DATA:  Chest pain EXAM: CHEST - 2 VIEW COMPARISON:  11/19/2012 FINDINGS: The heart size and mediastinal contours are within normal limits. Both lungs are clear. The visualized skeletal structures are unremarkable. IMPRESSION: Normal study. Electronically Signed   By: Rolm Baptise M.D.   On: 09/19/2020 01:58    Procedures Procedures (including critical care time)  Medications Ordered in ED Medications - No data to display  ED Course  I have reviewed the triage vital signs and the nursing notes.  Pertinent labs & imaging results that were available during my care of the patient were reviewed by me and considered in my medical decision making (see chart for details).    MDM Rules/Calculators/A&P                          Patient is 54 year old female with past medical history detailed above presented today for chest pain intermittently for several months. Here today for some left arm pain which began last night. She is concerned for having related to her heart.  Patient's chest pain is very atypical it is sharp and well localized she does endorse some pleuritic component however she is PERC negative and I have very low suspicion for a PE on account. She satting 100% on room air not tachycardic overall very well-appearing and states that her pain is actually resolved at this time with no intervention.  Chest x-ray is without any acute  abnormality agree of radiology read. CBC without leukocytosis or anemia BMP without any significant electrolyte abnormality. Troponin x2 within normal limits no significant delta. I-STAT ECG negative for pregnancy. EKG without any acute abnormality. No ST-T wave abnormalities that are significantly changed from prior--although there is some inferior ST depressions no reciprocal ST elevations. In her chest pain is very atypical. Low suspicion for this being related to new pathology.  Patient has heart score of 4. I discussed options with patient states that she would prefer to follow-up with cardiologist on outpatient basis. Placed ambulatory referral to cardiology. She will follow up with the [facility]. Would likely benefit from outpatient stress test.  Final Clinical Impression(s) / ED Diagnoses Final diagnoses:  Chest pain, unspecified type    Rx / DC Orders ED Discharge Orders         Ordered    Ambulatory referral to Cardiology        09/19/20 [place], [place], [place] 09/19/20 0745    Fatima Blank, MD 09/20/20 [phone]

## 2020-09-19 NOTE — ED Triage Notes (Signed)
Patient reports intermittent central chest pain radiating to left neck and left arm this weekend , mild SOB , occasional nausea , no emesis or diaphoresis , denies cough or fever .

## 2020-09-19 NOTE — Discharge Instructions (Signed)
Your work-up for chest pain in the emergency department today was reassuring.  As we discussed it is reasonable to have you follow-up with a cardiologist for further evaluation this may include further testing at their discretion.  Please return to the ER immediately for any new or concerning symptoms specifically worsening chest pain, worsening shortness of breath, nausea or vomiting.  Or any other new or concerning symptoms.  Your cardiac enzymes, EKG and chest x-ray were reassuring however if your symptoms change you may need reassessment.

## 2020-09-25 ENCOUNTER — Ambulatory Visit: Payer: 59 | Admitting: Cardiology

## 2020-11-01 DIAGNOSIS — R079 Chest pain, unspecified: Secondary | ICD-10-CM | POA: Diagnosis not present

## 2020-11-01 DIAGNOSIS — R9431 Abnormal electrocardiogram [ECG] [EKG]: Secondary | ICD-10-CM | POA: Diagnosis not present

## 2020-12-11 ENCOUNTER — Other Ambulatory Visit: Payer: Self-pay | Admitting: Internal Medicine

## 2020-12-11 DIAGNOSIS — Z131 Encounter for screening for diabetes mellitus: Secondary | ICD-10-CM | POA: Diagnosis not present

## 2020-12-11 DIAGNOSIS — K30 Functional dyspepsia: Secondary | ICD-10-CM | POA: Diagnosis not present

## 2020-12-11 DIAGNOSIS — J302 Other seasonal allergic rhinitis: Secondary | ICD-10-CM | POA: Diagnosis not present

## 2020-12-11 DIAGNOSIS — Z0000 Encounter for general adult medical examination without abnormal findings: Secondary | ICD-10-CM | POA: Diagnosis not present

## 2020-12-11 DIAGNOSIS — I10 Essential (primary) hypertension: Secondary | ICD-10-CM | POA: Diagnosis not present

## 2020-12-11 DIAGNOSIS — Z13220 Encounter for screening for lipoid disorders: Secondary | ICD-10-CM | POA: Diagnosis not present

## 2020-12-11 DIAGNOSIS — E559 Vitamin D deficiency, unspecified: Secondary | ICD-10-CM | POA: Diagnosis not present

## 2020-12-12 LAB — COMPLETE METABOLIC PANEL WITH GFR
AG Ratio: 1.3 (calc) (ref 1.0–2.5)
ALT: 15 U/L (ref 6–29)
AST: 17 U/L (ref 10–35)
Albumin: 4.3 g/dL (ref 3.6–5.1)
Alkaline phosphatase (APISO): 114 U/L (ref 37–153)
BUN: 18 mg/dL (ref 7–25)
CO2: 25 mmol/L (ref 20–32)
Calcium: 9.7 mg/dL (ref 8.6–10.4)
Chloride: 101 mmol/L (ref 98–110)
Creat: 0.87 mg/dL (ref 0.50–1.05)
GFR, Est African American: 88 mL/min/{1.73_m2} (ref >=60)
GFR, Est Non African American: 76 mL/min/{1.73_m2} (ref >=60)
Globulin: 3.2 g/dL (calc) (ref 1.9–3.7)
Glucose, Bld: 86 mg/dL (ref 65–99)
Potassium: 3.9 mmol/L (ref 3.5–5.3)
Sodium: 138 mmol/L (ref 135–146)
Total Bilirubin: 0.5 mg/dL (ref 0.2–1.2)
Total Protein: 7.5 g/dL (ref 6.1–8.1)

## 2020-12-12 LAB — CBC
HCT: 40.5 % (ref 35.0–45.0)
Hemoglobin: 13.6 g/dL (ref 11.7–15.5)
MCH: 29.4 pg (ref 27.0–33.0)
MCHC: 33.6 g/dL (ref 32.0–36.0)
MCV: 87.5 fL (ref 80.0–100.0)
MPV: 9.6 fL (ref 7.5–12.5)
Platelets: 340 10*3/uL (ref 140–400)
RBC: 4.63 10*6/uL (ref 3.80–5.10)
RDW: 12.2 % (ref 11.0–15.0)
WBC: 7.6 10*3/uL (ref 3.8–10.8)

## 2020-12-12 LAB — LIPID PANEL
Cholesterol: 209 mg/dL — ABNORMAL HIGH (ref <200)
HDL: 49 mg/dL — ABNORMAL LOW (ref >=50)
Non-HDL Cholesterol (Calc): 160 mg/dL (calc) — ABNORMAL HIGH (ref <130)
Total CHOL/HDL Ratio: 4.3 (calc) (ref <5.0)
Triglycerides: 503 mg/dL — ABNORMAL HIGH (ref <150)

## 2020-12-12 LAB — VITAMIN D 25 HYDROXY (VIT D DEFICIENCY, FRACTURES): Vit D, 25-Hydroxy: 23 ng/mL — ABNORMAL LOW (ref 30–100)

## 2020-12-12 LAB — TSH: TSH: 2.81 mIU/L

## 2020-12-20 DIAGNOSIS — R1013 Epigastric pain: Secondary | ICD-10-CM | POA: Diagnosis not present

## 2020-12-20 DIAGNOSIS — K760 Fatty (change of) liver, not elsewhere classified: Secondary | ICD-10-CM | POA: Diagnosis not present

## 2020-12-20 DIAGNOSIS — D259 Leiomyoma of uterus, unspecified: Secondary | ICD-10-CM | POA: Diagnosis not present

## 2021-01-07 DIAGNOSIS — I10 Essential (primary) hypertension: Secondary | ICD-10-CM | POA: Diagnosis not present

## 2021-01-07 DIAGNOSIS — Z1211 Encounter for screening for malignant neoplasm of colon: Secondary | ICD-10-CM | POA: Diagnosis not present

## 2021-01-07 DIAGNOSIS — R140 Abdominal distension (gaseous): Secondary | ICD-10-CM | POA: Diagnosis not present

## 2021-01-22 DIAGNOSIS — Z1211 Encounter for screening for malignant neoplasm of colon: Secondary | ICD-10-CM | POA: Diagnosis not present

## 2021-01-22 DIAGNOSIS — K5730 Diverticulosis of large intestine without perforation or abscess without bleeding: Secondary | ICD-10-CM | POA: Diagnosis not present

## 2021-03-19 DIAGNOSIS — E669 Obesity, unspecified: Secondary | ICD-10-CM | POA: Diagnosis not present

## 2021-03-19 DIAGNOSIS — K30 Functional dyspepsia: Secondary | ICD-10-CM | POA: Diagnosis not present

## 2021-03-19 DIAGNOSIS — I10 Essential (primary) hypertension: Secondary | ICD-10-CM | POA: Diagnosis not present

## 2021-03-19 DIAGNOSIS — J302 Other seasonal allergic rhinitis: Secondary | ICD-10-CM | POA: Diagnosis not present

## 2021-05-12 DIAGNOSIS — Z20822 Contact with and (suspected) exposure to covid-19: Secondary | ICD-10-CM | POA: Diagnosis not present

## 2022-04-09 DIAGNOSIS — Z1151 Encounter for screening for human papillomavirus (HPV): Secondary | ICD-10-CM | POA: Diagnosis not present

## 2022-04-09 DIAGNOSIS — Z6838 Body mass index (BMI) 38.0-38.9, adult: Secondary | ICD-10-CM | POA: Diagnosis not present

## 2022-04-09 DIAGNOSIS — Z1231 Encounter for screening mammogram for malignant neoplasm of breast: Secondary | ICD-10-CM | POA: Diagnosis not present

## 2022-04-09 DIAGNOSIS — Z124 Encounter for screening for malignant neoplasm of cervix: Secondary | ICD-10-CM | POA: Diagnosis not present

## 2022-04-09 DIAGNOSIS — Z01419 Encounter for gynecological examination (general) (routine) without abnormal findings: Secondary | ICD-10-CM | POA: Diagnosis not present

## 2022-10-29 DIAGNOSIS — Z13220 Encounter for screening for lipoid disorders: Secondary | ICD-10-CM | POA: Diagnosis not present

## 2022-10-29 DIAGNOSIS — Z0000 Encounter for general adult medical examination without abnormal findings: Secondary | ICD-10-CM | POA: Diagnosis not present

## 2022-10-29 DIAGNOSIS — I10 Essential (primary) hypertension: Secondary | ICD-10-CM | POA: Diagnosis not present

## 2022-10-29 DIAGNOSIS — Z91013 Allergy to seafood: Secondary | ICD-10-CM | POA: Diagnosis not present

## 2022-10-29 DIAGNOSIS — E559 Vitamin D deficiency, unspecified: Secondary | ICD-10-CM | POA: Diagnosis not present

## 2022-10-29 DIAGNOSIS — E2839 Other primary ovarian failure: Secondary | ICD-10-CM | POA: Diagnosis not present

## 2022-10-29 DIAGNOSIS — Z131 Encounter for screening for diabetes mellitus: Secondary | ICD-10-CM | POA: Diagnosis not present

## 2023-04-30 DIAGNOSIS — M179 Osteoarthritis of knee, unspecified: Secondary | ICD-10-CM | POA: Diagnosis not present

## 2023-04-30 DIAGNOSIS — Z6837 Body mass index (BMI) 37.0-37.9, adult: Secondary | ICD-10-CM | POA: Diagnosis not present

## 2023-04-30 DIAGNOSIS — E669 Obesity, unspecified: Secondary | ICD-10-CM | POA: Diagnosis not present

## 2023-04-30 DIAGNOSIS — J302 Other seasonal allergic rhinitis: Secondary | ICD-10-CM | POA: Diagnosis not present

## 2023-04-30 DIAGNOSIS — I10 Essential (primary) hypertension: Secondary | ICD-10-CM | POA: Diagnosis not present

## 2023-04-30 DIAGNOSIS — Z7189 Other specified counseling: Secondary | ICD-10-CM | POA: Diagnosis not present

## 2023-06-02 DIAGNOSIS — I10 Essential (primary) hypertension: Secondary | ICD-10-CM | POA: Diagnosis not present

## 2023-06-02 DIAGNOSIS — E669 Obesity, unspecified: Secondary | ICD-10-CM | POA: Diagnosis not present

## 2023-06-30 DIAGNOSIS — E668 Other obesity: Secondary | ICD-10-CM | POA: Diagnosis not present

## 2023-06-30 DIAGNOSIS — Z6836 Body mass index (BMI) 36.0-36.9, adult: Secondary | ICD-10-CM | POA: Diagnosis not present

## 2023-06-30 DIAGNOSIS — Z7189 Other specified counseling: Secondary | ICD-10-CM | POA: Diagnosis not present

## 2023-06-30 DIAGNOSIS — Z1239 Encounter for other screening for malignant neoplasm of breast: Secondary | ICD-10-CM | POA: Diagnosis not present

## 2023-06-30 DIAGNOSIS — J302 Other seasonal allergic rhinitis: Secondary | ICD-10-CM | POA: Diagnosis not present

## 2023-06-30 DIAGNOSIS — I10 Essential (primary) hypertension: Secondary | ICD-10-CM | POA: Diagnosis not present

## 2023-10-29 DIAGNOSIS — Z01419 Encounter for gynecological examination (general) (routine) without abnormal findings: Secondary | ICD-10-CM | POA: Diagnosis not present

## 2023-10-29 DIAGNOSIS — Z6838 Body mass index (BMI) 38.0-38.9, adult: Secondary | ICD-10-CM | POA: Diagnosis not present

## 2023-10-29 DIAGNOSIS — Z1231 Encounter for screening mammogram for malignant neoplasm of breast: Secondary | ICD-10-CM | POA: Diagnosis not present

## 2023-10-29 DIAGNOSIS — Z1151 Encounter for screening for human papillomavirus (HPV): Secondary | ICD-10-CM | POA: Diagnosis not present

## 2023-10-29 DIAGNOSIS — Z124 Encounter for screening for malignant neoplasm of cervix: Secondary | ICD-10-CM | POA: Diagnosis not present

## 2024-12-15 ENCOUNTER — Encounter: Payer: Self-pay | Admitting: Dermatology

## 2024-12-15 ENCOUNTER — Ambulatory Visit: Payer: Self-pay | Admitting: Dermatology

## 2024-12-15 VITALS — BP 137/84

## 2024-12-15 DIAGNOSIS — L649 Androgenic alopecia, unspecified: Secondary | ICD-10-CM

## 2024-12-15 DIAGNOSIS — L219 Seborrheic dermatitis, unspecified: Secondary | ICD-10-CM

## 2024-12-15 DIAGNOSIS — L658 Other specified nonscarring hair loss: Secondary | ICD-10-CM | POA: Diagnosis not present

## 2024-12-15 MED ORDER — CLOBETASOL PROPIONATE 0.05 % EX SOLN: 1.0000 | Freq: Every day | CUTANEOUS | 5 refills | Status: AC | PRN

## 2024-12-15 MED ORDER — SAFETY SEAL MISCELLANEOUS MISC: 11 refills | Status: AC

## 2024-12-15 NOTE — Progress Notes (Signed)
" ° °  New Patient Visit   Subjective  Mckenzie Hill is a 59 y.o. female who presents for a NEW PATIENT appointment to be examined for the concerns as listed below.   Hair Loss: Pr stated that she first noticed hair loss in July 2025 as a bald area in the crown area. She stated that some of the hair has regrown but looking for Tx options and she has not tried anything OTC. In the crown area she stated it is always itchy - rating it 7-8/10. Pt mentioned it is hair loss in the Fx stating that sisters and grandmother has thinning hair. Pt stated she did relaxer her hair about 20years and stopping around the age of 49. She mentioned that her hair has fell out twice before in 2005 with only hair remaining around the perimeter and again in 2014.   Are you nursing, pregnant or trying to conceive? no   The following portions of the chart were reviewed this encounter and updated as appropriate: medications, allergies, medical history  Review of Systems:  No other skin or systemic complaints except as noted in HPI or Assessment and Plan.  Objective  Well appearing patient in no apparent distress; mood and affect are within normal limits.   A focused examination was performed of the following areas: scalp   Relevant exam findings are noted in the Assessment and Plan.                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 Treatable but not curable, requiring continuous treatment to prevent progression. - Prescribed topical solution containing minoxidil and finasteride to be applied every morning. - Instructed to apply a thin layer to the scalp, focusing on the hairline and crown. - Advised to use a cotton ball or finger to apply the solution to avoid dripping. - Discussed the option of oral minoxidil if topical treatment is not preferred, noting the risk of  unwanted hair growth in other areas. - Educated on the importance of continuous treatment to maintain hair growth.  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 Improvement noted with breaks between braiding sessions. - Advised to continue taking breaks between braiding sessions to prevent further traction alopecia.  Seborrheic dermatitis of scalp Intermittent itching of the scalp, likely due to seborrheic dermatitis, possibly exacerbated by traction alopecia. Itching occurs 1-3 times a week and is intense at times. - Prescribed clobetasol  topical solution for itching, to be applied as needed. - Instructed to apply clobetasol  immediately upon feeling itching to reduce inflammation and stop itching within 5-10 minutes.      No follow-ups on file.   Documentation: I have reviewed the above documentation for accuracy and completeness, and I agree with the above.  I, Shirron Maranda, CMA II, am acting as scribe for:   Delon Lenis, DO     "

## 2024-12-15 NOTE — Patient Instructions (Addendum)
 VISIT SUMMARY:  During your visit, we discussed your concerns about hair thinning and scalp itching. We identified three main issues contributing to your symptoms: androgenetic alopecia, traction alopecia, and seborrheic dermatitis.  YOUR PLAN:  -ANDROGENETIC ALOPECIA:  Androgenetic alopecia is a common form of hair loss due to genetic predisposition and age-related hormonal changes. It is treatable but requires continuous treatment to prevent progression.  You have been prescribed a topical solution containing minoxidil and finasteride to be applied every morning. Apply a thin layer to your scalp, focusing on the hairline and crown, using a cotton ball or your finger to avoid dripping. Continuous treatment is essential to maintain hair growth.  -TRACTION ALOPECIA:  Traction alopecia is hair loss caused by repetitive hairstyles that pull on the hair, such as braiding. Improvement has been noted with breaks between braiding sessions. Continue taking breaks between braiding sessions to prevent further hair loss.  -SEBORRHEIC DERMATITIS OF SCALP:  Seborrheic dermatitis is a condition that causes intermittent itching and inflammation of the scalp. It may be exacerbated by traction alopecia. You have been prescribed clobetasol  topical solution for itching. Apply clobetasol  solution immediately upon feeling itching to reduce inflammation and stop itching within 5-10 minutes.  INSTRUCTIONS:  Please follow the prescribed treatments and continue taking breaks between braiding sessions. If you have any questions or concerns, or if your symptoms do not improve, schedule a follow-up appointment.   Important Information  Due to recent changes in healthcare laws, you may see results of your pathology and/or laboratory studies on MyChart before the doctors have had a chance to review them. We understand that in some cases there may be results that are confusing or concerning to you. Please understand that  not all results are received at the same time and often the doctors may need to interpret multiple results in order to provide you with the best plan of care or course of treatment. Therefore, we ask that you please give us  2 business days to thoroughly review all your results before contacting the office for clarification. Should we see a critical lab result, you will be contacted sooner.   If You Need Anything After Your Visit  If you have any questions or concerns for your doctor, please call our main line at [phone] If no one answers, please leave a voicemail as directed and we will return your call as soon as possible. Messages left after 4 pm will be answered the following business day.   You may also send us  a message via MyChart. We typically respond to MyChart messages within 1-2 business days.  For prescription refills, please ask your pharmacy to contact our office. Our fax number is [phone].  If you have an urgent issue when the clinic is closed that cannot wait until the next business day, you can page your doctor at the number below.    Please note that while we do our best to be available for urgent issues outside of office hours, we are not available 24/7.   If you have an urgent issue and are unable to reach us , you may choose to seek medical care at your doctor's office, retail clinic, urgent care center, or emergency room.  If you have a medical emergency, please immediately call 911 or go to the emergency department. In the event of inclement weather, please call our main line at [phone] for an update on the status of any delays or closures.  Dermatology Medication Tips: Please keep the boxes that topical  medications come in in order to help keep track of the instructions about where and how to use these. Pharmacies typically print the medication instructions only on the boxes and not directly on the medication tubes.   If your medication is too expensive,  please contact our office at [phone] or send us  a message through MyChart.   We are unable to tell what your co-pay for medications will be in advance as this is different depending on your insurance coverage. However, we may be able to find a substitute medication at lower cost or fill out paperwork to get insurance to cover a needed medication.   If a prior authorization is required to get your medication covered by your insurance company, please allow us  1-2 business days to complete this process.  Drug prices often vary depending on where the prescription is filled and some pharmacies may offer cheaper prices.  The website www.goodrx.com contains coupons for medications through different pharmacies. The prices here do not account for what the cost may be with help from insurance (it may be cheaper with your insurance), but the website can give you the price if you did not use any insurance.  - You can print the associated coupon and take it with your prescription to the pharmacy.  - You may also stop by our office during regular business hours and pick up a GoodRx coupon card.  - If you need your prescription sent electronically to a different pharmacy, notify our office through [facility] or by phone at [phone]

## 2025-04-05 ENCOUNTER — Ambulatory Visit: Admitting: Dermatology
